# Patient Record
Sex: Female | Born: 1955 | Race: Black or African American | Hispanic: No | Marital: Married | Smoking: Current every day smoker
Health system: Southern US, Community
[De-identification: ages and names within clinical notes are randomized; demographics above are authoritative.]

## PROBLEM LIST (undated history)

## (undated) DIAGNOSIS — I10 Essential (primary) hypertension: Secondary | ICD-10-CM

## (undated) DIAGNOSIS — J45909 Unspecified asthma, uncomplicated: Secondary | ICD-10-CM

## (undated) DIAGNOSIS — M069 Rheumatoid arthritis, unspecified: Secondary | ICD-10-CM

## (undated) DIAGNOSIS — F329 Major depressive disorder, single episode, unspecified: Secondary | ICD-10-CM

## (undated) DIAGNOSIS — E785 Hyperlipidemia, unspecified: Secondary | ICD-10-CM

## (undated) DIAGNOSIS — F32A Depression, unspecified: Secondary | ICD-10-CM

## (undated) HISTORY — DX: Hyperlipidemia, unspecified: E78.5

## (undated) HISTORY — DX: Essential (primary) hypertension: I10

## (undated) HISTORY — DX: Major depressive disorder, single episode, unspecified: F32.9

## (undated) HISTORY — DX: Unspecified asthma, uncomplicated: J45.909

## (undated) HISTORY — DX: Rheumatoid arthritis, unspecified: M06.9

## (undated) HISTORY — PX: MOUTH SURGERY: SHX715

## (undated) HISTORY — DX: Depression, unspecified: F32.A

## (undated) HISTORY — PX: FRACTURE SURGERY: SHX138

## (undated) HISTORY — PX: ABDOMINAL HYSTERECTOMY: SHX81

---

## 2009-10-16 LAB — HM COLONOSCOPY

## 2010-06-08 ENCOUNTER — Encounter: Payer: Self-pay | Admitting: Nurse Practitioner

## 2010-06-08 DIAGNOSIS — E785 Hyperlipidemia, unspecified: Secondary | ICD-10-CM | POA: Insufficient documentation

## 2010-06-08 DIAGNOSIS — I10 Essential (primary) hypertension: Secondary | ICD-10-CM

## 2010-08-27 ENCOUNTER — Encounter: Payer: Self-pay | Admitting: Nurse Practitioner

## 2011-04-02 ENCOUNTER — Ambulatory Visit (INDEPENDENT_AMBULATORY_CARE_PROVIDER_SITE_OTHER): Payer: BC Managed Care – PPO | Admitting: Internal Medicine

## 2011-04-02 ENCOUNTER — Encounter: Payer: Self-pay | Admitting: Internal Medicine

## 2011-04-02 VITALS — BP 130/82 | HR 99 | Temp 98.0°F | Ht 61.0 in | Wt 99.0 lb

## 2011-04-02 DIAGNOSIS — F17200 Nicotine dependence, unspecified, uncomplicated: Secondary | ICD-10-CM

## 2011-04-02 DIAGNOSIS — J439 Emphysema, unspecified: Secondary | ICD-10-CM | POA: Insufficient documentation

## 2011-04-02 DIAGNOSIS — J449 Chronic obstructive pulmonary disease, unspecified: Secondary | ICD-10-CM

## 2011-04-02 MED ORDER — BUDESONIDE-FORMOTEROL FUMARATE 160-4.5 MCG/ACT IN AERO: INHALATION_SPRAY | RESPIRATORY_TRACT | Status: DC

## 2011-04-02 NOTE — Progress Notes (Signed)
  Subjective:    Patient ID: Mallory [name], female    DOB: [birth year], 56 y.o.   MRN: [id number]  HPI  37 yobf smoker with new onset nasal symptoms in 30's mainly in Spring or late Fall when furnaces came with tendency to bronchitis with flares of sinus symptoms  And about 2009 also needed an inhaler but not needing daily x in spring and summer.  04/02/2011 1st pulmonary eval cc 6 months of indolent onset persistent increase sob/ cough/ congestion better with abx and prednisone requiring ER trip to [facility] in Dec 2012 > some better since then  To point where doesn't need albuterol since one week ago. Mucus is white, no limiting sob though not aerobically active. hfa poor (see below).  Sleeping ok without nocturnal  or early am exacerbation  of respiratory  c/o's or need for noct saba. Also denies any obvious fluctuation of symptoms with weather or environmental changes or other aggravating or alleviating factors except as outlined above   Review of Systems  Constitutional: Positive for appetite change and unexpected weight change. Negative for fever.  HENT: Positive for congestion and sneezing. Negative for ear pain, nosebleeds, sore throat, rhinorrhea, trouble swallowing, dental problem, postnasal drip and sinus pressure.   Eyes: Negative for redness and itching.  Respiratory: Positive for cough and shortness of breath. Negative for chest tightness and wheezing.   Cardiovascular: Negative for palpitations and leg swelling.  Gastrointestinal: Negative for nausea and vomiting.  Genitourinary: Negative for dysuria.  Musculoskeletal: Negative for joint swelling.  Skin: Negative for rash.  Neurological: Negative for headaches.  Hematological: Does not bruise/bleed easily.  Psychiatric/Behavioral: Negative for dysphoric mood. The patient is not nervous/anxious.        Objective:   Physical Exam  amb bf minimally cushingnoid  Wt 99 04/02/2011  HEENT: nl dentition, turbinates, and orophanx.  Nl external ear canals without cough reflex   NECK :  without JVD/Nodes/TM/ nl carotid upstrokes bilaterally   LUNGS: no acc muscle use, clear to A and P bilaterally without cough on insp or exp maneuvers   CV:  RRR  no s3 or murmur or increase in P2, no edema   ABD:  soft and nontender with nl excursion in the supine position. No bruits or organomegaly, bowel sounds nl  MS:  warm without deformities, calf tenderness, cyanosis or clubbing  SKIN: warm and dry without lesions    NEURO:  alert, approp, no deficits   CT chest morehead Dec 2012 "nl" per pt report     Assessment & Plan:

## 2011-04-02 NOTE — Assessment & Plan Note (Signed)
I took an extended  opportunity with this patient to outline the consequences of continued cigarette use  in airway disorders based on all the data we have from the multiple national lung health studies (perfomed over decades at millions of dollars in cost)  indicating that smoking cessation, not choice of inhalers or physicians, is the most important aspect of care.   

## 2011-04-02 NOTE — Patient Instructions (Signed)
Symbicort 160 Take 2 puffs first thing in am and then another 2 puffs about 12 hours later.    Work on Musician technique:  relax and gently blow all the way out then take a nice smooth deep breath back in, triggering the inhaler at same time you start breathing in.  Hold for up to 5 seconds if you can and blow out through the nose.  Rinse and gargle with water when done   If your mouth or throat starts to bother you,   I suggest you time the inhaler to your dental care and after using the inhaler(s) brush teeth and tongue with a baking soda containing toothpaste and when you rinse this out, gargle with it first to see if this helps your mouth and throat.    Stopping smoking before it stops you.  Please schedule a follow up visit in 3 months but call sooner if needed

## 2011-04-02 NOTE — Assessment & Plan Note (Signed)
DDX of  difficult airways managment all start with A and  include Adherence, Ace Inhibitors, Acid Reflux, Active Sinus Disease, Alpha 1 Antitripsin deficiency, Anxiety masquerading as Airways dz,  ABPA,  allergy(esp in young), Aspiration (esp in elderly), Adverse effects of DPI,  Active smokers, plus two Bs  = Bronchiectasis and Beta blocker use..and one C= CHF  Active smoking biggest concern, discussed separately  Adherence is always the initial "prime suspect" and is a multilayered concern that requires a "trust but verify" approach in every patient - starting with knowing how to use medications, especially inhalers, correctly, keeping up with refills and understanding the fundamental difference between maintenance and prns vs those medications only taken for a very short course and then stopped and not refilled. The proper method of use, as well as anticipated side effects, of this metered-dose inhaler are discussed and demonstrated to the patient. Improved to 75% with coaching.  Squirt gun to put out a fire analogy reviewed (waiting too long results in use a firehose = prednisone). Ok to use symbicort 2 bid prn in this setting.

## 2011-04-23 ENCOUNTER — Telehealth: Payer: Self-pay | Admitting: *Deleted

## 2011-04-23 NOTE — Telephone Encounter (Signed)
I [id number] to schedule appt in [place] in April. Carron Curie, CMA

## 2011-04-24 NOTE — Telephone Encounter (Signed)
Scheduled appt for pt in April per jennifer nothing further needed.Mallory [name]

## 2011-06-18 ENCOUNTER — Ambulatory Visit: Payer: BC Managed Care – PPO | Admitting: Internal Medicine

## 2011-07-23 ENCOUNTER — Ambulatory Visit (INDEPENDENT_AMBULATORY_CARE_PROVIDER_SITE_OTHER): Payer: BC Managed Care – PPO | Admitting: Internal Medicine

## 2011-07-23 ENCOUNTER — Encounter: Payer: Self-pay | Admitting: Internal Medicine

## 2011-07-23 VITALS — BP 120/82 | HR 97 | Temp 97.7°F | Ht 61.5 in | Wt 100.0 lb

## 2011-07-23 DIAGNOSIS — J449 Chronic obstructive pulmonary disease, unspecified: Secondary | ICD-10-CM

## 2011-07-23 DIAGNOSIS — J4489 Other specified chronic obstructive pulmonary disease: Secondary | ICD-10-CM

## 2011-07-23 DIAGNOSIS — J310 Chronic rhinitis: Secondary | ICD-10-CM

## 2011-07-23 MED ORDER — PREDNISONE (PAK) 10 MG PO TABS: ORAL_TABLET | ORAL | Status: AC

## 2011-07-23 NOTE — Assessment & Plan Note (Signed)
Doing better on symbicort 160 2bid but warned the symbicort does not change the natural hx of the dz  I had an extended discussion with the patient today lasting 15 to 20 minutes of a 25 minute visit on the following issues: I reviewed the Flethcher curve with patient that basically indicates  if you quit smoking when your best day FEV1 is still well preserved (as hers appears to be)  it is highly unlikely you will progress to severe disease and informed the patient there was no medication on the market that has proven to change the curve or the likelihood of progression.  Therefore stopping smoking and maintaining abstinence is the most important aspect of care, not choice of inhalers or for that matter, doctors.    Needs pft's to complete the w/u and more efforts to completely stop smoking in meantime

## 2011-07-23 NOTE — Assessment & Plan Note (Addendum)
Try NS irrigation/ one short course predisone but no abx in absence of symptoms or signs of active infection.   f/u ent prn as does not want to try nasal steroids and most likely this problem is related to smoking also, not allergies

## 2011-07-23 NOTE — Patient Instructions (Addendum)
No change on symbicort.  Prednisone 10 mg take  4 each am x 2 days,   2 each am x 2 days,  1 each am x2days and stop   Saline nasal spray is best bet for your nasal congestion  The key is to stop smoking completely before smoking completely stops you!   Please schedule a follow up visit in 3 months but call sooner if needed with PFT's in [facility]

## 2011-07-23 NOTE — Progress Notes (Signed)
  Subjective:    Patient ID: Mallory [name], female    DOB: 11-Apr-1955    MRN: [id number]  HPI  61 yobf smoker with new onset nasal symptoms in 30's mainly in Spring or late Fall when furnaces came with tendency to bronchitis with flares of sinus symptoms  And about 2009 also needed an inhaler but not needing daily x in spring and summer.  04/02/2011 1st pulmonary eval cc 6 months of indolent onset persistent increase sob/ cough/ congestion better with abx and prednisone requiring ER trip to [facility] in Dec 2012 > some better since then  To point where doesn't need albuterol since one week ago. Mucus is white, no limiting sob though not aerobically active. hfa poor (see below). rec Symbicort 160 Take 2 puffs first thing in am and then another 2 puffs about 12 hours later.  Work on Musician technique  07/23/2011 f/u ov/Mallory [name] still smoking but no prednisone since Dec 2012 cc c/o sinus congestion and tickle in throat but breathing better on symbicort, not really agreeable to nasal spays at this point, has tried in past and "they all burn".  No purulent sputum, no sinus pain or hb symptoms.   Sleeping ok without nocturnal  or early am exacerbation  of respiratory  c/o's or need for noct saba. Also denies any obvious fluctuation of symptoms with weather or environmental changes or other aggravating or alleviating factors except as outlined above.  ROS  At present neg for  any significant sore throat, dysphagia, dental problems,  ear ache,   fever, chills, sweats, unintended wt loss, pleuritic or exertional cp, hemoptysis, palpitations, orthopnea pnd or leg swelling.  Also denies presyncope, palpitations, heartburn, abdominal pain, anorexia, nausea, vomiting, diarrhea  or change in bowel or urinary habits, change in stools or urine, dysuria,hematuria,  rash, arthralgias, visual complaints, headache, numbness weakness or ataxia or problems with walking or coordination. No noted change in  mood/affect or memory.                         Objective:   Physical Exam  amb bf minimally cushingnoid  Wt 99 04/02/2011 > 07/23/2011  100 HEENT: nl dentition, turbinates, and orophanx. Nl external ear canals without cough reflex   NECK :  without JVD/Nodes/TM/ nl carotid upstrokes bilaterally   LUNGS: no acc muscle use, clear to A and P bilaterally without cough on insp or exp maneuvers   CV:  RRR  no s3 or murmur or increase in P2, no edema   ABD:  soft and nontender with nl excursion in the supine position. No bruits or organomegaly, bowel sounds nl  MS:  warm without deformities, calf tenderness, cyanosis or clubbing      CT chest morehead Dec 2012 "nl" per pt report     Assessment & Plan:

## 2011-10-15 ENCOUNTER — Ambulatory Visit: Payer: BC Managed Care – PPO | Admitting: Internal Medicine

## 2012-06-03 ENCOUNTER — Other Ambulatory Visit: Payer: Self-pay | Admitting: Family Medicine

## 2012-06-03 ENCOUNTER — Telehealth: Payer: Self-pay | Admitting: Pharmacist

## 2012-06-03 DIAGNOSIS — G4700 Insomnia, unspecified: Secondary | ICD-10-CM

## 2012-06-03 MED ORDER — DOXEPIN HCL 10 MG PO CAPS: 10.0000 mg | ORAL_CAPSULE | Freq: Every day | ORAL | Status: DC

## 2012-06-03 NOTE — Telephone Encounter (Signed)
Doxepin 10mg  1poqhs rx sent to [facility] to sign off and send to [facility] to replace silenor 3mg  which is not covered by pt's insurance - [place] attempted and denied.

## 2012-06-23 ENCOUNTER — Other Ambulatory Visit: Payer: Self-pay | Admitting: Family Medicine

## 2012-06-25 ENCOUNTER — Other Ambulatory Visit: Payer: Self-pay | Admitting: *Deleted

## 2012-06-25 MED ORDER — MONTELUKAST SODIUM 10 MG PO TABS: 10.0000 mg | ORAL_TABLET | Freq: Every day | ORAL | Status: DC

## 2012-07-07 ENCOUNTER — Telehealth: Payer: Self-pay | Admitting: *Deleted

## 2012-07-07 DIAGNOSIS — G4700 Insomnia, unspecified: Secondary | ICD-10-CM

## 2012-07-07 NOTE — Telephone Encounter (Deleted)
Ins . Co ([employer]) notified that the silenor on Mallory [name] has been denied for a second time.  w

## 2012-07-07 NOTE — Telephone Encounter (Signed)
pateint is on Doxepin which is very similar. DO we need to increase dose?

## 2012-07-07 NOTE — Telephone Encounter (Signed)
[employer] co denied the silenor for the second time, don't know if you want to prescribe something else.

## 2012-07-09 MED ORDER — DOXEPIN HCL 25 MG PO CAPS: 25.0000 mg | ORAL_CAPSULE | Freq: Every day | ORAL | Status: DC

## 2012-07-09 NOTE — Telephone Encounter (Signed)
Rx sent to [facility]

## 2012-07-09 NOTE — Telephone Encounter (Signed)
CALLED INTO PHARMACY

## 2012-07-09 NOTE — Telephone Encounter (Signed)
Yes [facility] [place]

## 2012-07-24 ENCOUNTER — Telehealth: Payer: Self-pay | Admitting: Nurse Practitioner

## 2012-07-24 ENCOUNTER — Encounter: Payer: Self-pay | Admitting: Nurse Practitioner

## 2012-07-24 ENCOUNTER — Ambulatory Visit (INDEPENDENT_AMBULATORY_CARE_PROVIDER_SITE_OTHER): Payer: BC Managed Care – PPO | Admitting: Nurse Practitioner

## 2012-07-24 VITALS — BP 133/86 | HR 81 | Temp 98.6°F | Ht 60.0 in | Wt 103.5 lb

## 2012-07-24 DIAGNOSIS — J322 Chronic ethmoidal sinusitis: Secondary | ICD-10-CM

## 2012-07-24 MED ORDER — AZITHROMYCIN 250 MG PO TABS: ORAL_TABLET | ORAL | Status: DC

## 2012-07-24 MED ORDER — FLUTICASONE PROPIONATE 50 MCG/ACT NA SUSP: 2.0000 | Freq: Every day | NASAL | Status: DC

## 2012-07-24 NOTE — Patient Instructions (Signed)

## 2012-07-24 NOTE — Telephone Encounter (Signed)
appt made for today 

## 2012-07-24 NOTE — Progress Notes (Signed)
  Subjective:    Patient ID: Mallory [name], female    DOB: Sep 02, 1955, 57 y.o.   MRN: [id number]  HPI- Patient in c/o sinus congestion and cough. She has been taking allergy meds which hasn't helped a all. Clear nasal discharge This started about 2 weeks ago. Intermittent fever, decrease in appetite.    Review of Systems  Constitutional: Positive for fever and fatigue.  HENT: Positive for ear pain, congestion, rhinorrhea, sneezing, postnasal drip and sinus pressure.   Respiratory: Positive for cough (nonproductive).   Cardiovascular: Negative.        Objective:   Physical Exam  Constitutional: She is oriented to person, place, and time. She appears well-developed and well-nourished.  HENT:  Right Ear: Hearing, tympanic membrane, external ear and ear canal normal.  Left Ear: Hearing, tympanic membrane, external ear and ear canal normal.  Nose: Mucosal edema and rhinorrhea present.  Mouth/Throat: Mucous membranes are pale. Posterior oropharyngeal erythema: mild.  Cardiovascular: Normal rate, normal heart sounds and intact distal pulses.   Pulmonary/Chest: Effort normal and breath sounds normal.  Course breath sounds  Abdominal: Soft. Bowel sounds are normal.  Neurological: She is alert and oriented to person, place, and time.  Skin: Skin is warm.  Psychiatric: She has a normal mood and affect. Her behavior is normal. Judgment and thought content normal.   BP 133/86  Pulse 81  Temp(Src) 98.6 F (37 C) (Oral)  Ht 5' (1.524 m)  Wt 103 lb 8 oz (46.947 kg)  BMI 20.21 kg/m2        Assessment & Plan:  1. Ethmoid sinusitis *1. Take meds as prescribed 2. Use a cool mist humidifier especially during the winter months and when heat has  been humid. 3. Use saline nose sprays frequently 4. Saline irrigations of the nose can be very helpful if done frequently.  * 4X daily for 1 week*  * Use of a nettie pot can be helpful with this. Follow directions with this* 5. Drink plenty of  fluids 6. Keep thermostat turn down low 7.For any cough or congestion  Use plain Mucinex- regular strength or max strength is fine   * Children- consult with Pharmacist for dosing 8. For fever or aces or pains- take tylenol or ibuprofen appropriate for age and weight.  * for fevers greater than 101 orally you may alternate ibuprofen and tylenol every  3 hours.   - azithromycin (ZITHROMAX Z-PAK) 250 MG tablet; As directed  Dispense: 6 each; Refill: 0 - fluticasone (FLONASE) 50 MCG/ACT nasal spray; Place 2 sprays into the nose daily.  Dispense: 16 g; Refill: 6 Mary-Margaret Daphine Deutscher, FNP

## 2012-08-05 ENCOUNTER — Other Ambulatory Visit: Payer: Self-pay | Admitting: Nurse Practitioner

## 2012-08-21 ENCOUNTER — Other Ambulatory Visit: Payer: Self-pay | Admitting: Nurse Practitioner

## 2012-11-06 ENCOUNTER — Encounter: Payer: Self-pay | Admitting: Physician Assistant

## 2012-11-06 ENCOUNTER — Ambulatory Visit (INDEPENDENT_AMBULATORY_CARE_PROVIDER_SITE_OTHER): Payer: BC Managed Care – PPO

## 2012-11-06 ENCOUNTER — Ambulatory Visit (INDEPENDENT_AMBULATORY_CARE_PROVIDER_SITE_OTHER): Payer: BC Managed Care – PPO | Admitting: Physician Assistant

## 2012-11-06 VITALS — BP 121/78 | HR 102 | Temp 98.3°F | Ht 60.0 in | Wt 103.0 lb

## 2012-11-06 DIAGNOSIS — M25521 Pain in right elbow: Secondary | ICD-10-CM

## 2012-11-06 DIAGNOSIS — M25529 Pain in unspecified elbow: Secondary | ICD-10-CM

## 2012-11-06 DIAGNOSIS — M6580 Other synovitis and tenosynovitis, unspecified site: Secondary | ICD-10-CM

## 2012-11-06 DIAGNOSIS — M7710 Lateral epicondylitis, unspecified elbow: Secondary | ICD-10-CM

## 2012-11-06 DIAGNOSIS — M7711 Lateral epicondylitis, right elbow: Secondary | ICD-10-CM

## 2012-11-06 DIAGNOSIS — M778 Other enthesopathies, not elsewhere classified: Secondary | ICD-10-CM

## 2012-11-06 MED ORDER — DICLOFENAC POTASSIUM 50 MG PO TABS: 50.0000 mg | ORAL_TABLET | Freq: Three times a day (TID) | ORAL | Status: DC

## 2012-11-06 NOTE — Patient Instructions (Signed)
Wear elbow brace for 4-6 wks. Take medication as prescribed. Refrain from using hand/arm in any movement that aggravates and causes pain until pain free. If you begin having palpitations, stop taking medication immediately. If symptoms persist or worsen, f/u in office for referral for MRI.

## 2012-11-25 NOTE — Progress Notes (Signed)
  Subjective:    Patient ID: Mallory [name], female    DOB: 1956-03-13, 57 y.o.   MRN: [id number]  HPI 57 y/o female presents for intermittent R elbow pain and weakness x 3 wks. Pain is worse with picking up items and partially relieved with rest. She has tried OTC ibuprofen 400mg  occasionally with only minimal relief. No known injury.     Review of Systems  Constitutional: Negative.   HENT: Negative.   Eyes: Negative.   Endocrine: Negative.   Musculoskeletal: Positive for myalgias and arthralgias. Negative for back pain, joint swelling and gait problem.  Skin: Negative for color change, pallor, rash and wound.       Objective:   Physical Exam  Constitutional: She is oriented to person, place, and time. She appears well-developed and well-nourished. No distress.  Musculoskeletal: She exhibits no edema and no tenderness.  Negative for DeQuirvan's Positive resisted pronation and supination suggesting epicondylitis.   Neurological: She is alert and oriented to person, place, and time.  Skin: She is not diaphoretic.          Assessment & Plan:  Epicondylitis: Xrays were negative for fracture so I feel like this is more muscular and tendon related. I have instructed patient to wear an elbow brace during the day, off at night for several weeks until symptoms resolve. I prescribed Diclofenac PO for symptomatic relief. Advised patient to avoid lifting or straining. If s/s do have not resolved or been significantly relieved following treatment plan, I suggested she follow up with an orthopedist for further assessment.

## 2012-12-02 ENCOUNTER — Other Ambulatory Visit: Payer: Self-pay | Admitting: Family Medicine

## 2013-01-07 ENCOUNTER — Telehealth: Payer: Self-pay | Admitting: General Practice

## 2013-01-07 NOTE — Telephone Encounter (Signed)
Needs to be seen, especially with cough that has lasted 3 weeks.

## 2013-01-12 NOTE — Telephone Encounter (Signed)
Pt called and aware will need to be seen- she was offered appt for pm clinic due to work schedule and she denied it and states she will call back if she decides to come.

## 2013-01-13 ENCOUNTER — Encounter: Payer: Self-pay | Admitting: Family Medicine

## 2013-01-13 ENCOUNTER — Ambulatory Visit (INDEPENDENT_AMBULATORY_CARE_PROVIDER_SITE_OTHER): Payer: BC Managed Care – PPO | Admitting: Family Medicine

## 2013-01-13 VITALS — BP 128/83 | HR 83 | Temp 98.7°F | Ht 61.0 in | Wt 100.0 lb

## 2013-01-13 DIAGNOSIS — J209 Acute bronchitis, unspecified: Secondary | ICD-10-CM

## 2013-01-13 LAB — POCT CBC
Granulocyte percent: 43.9 %G (ref 37–80)
HCT, POC: 44.4 % (ref 37.7–47.9)
Hemoglobin: 14.6 g/dL (ref 12.2–16.2)
Lymph, poc: 4 — AB (ref 0.6–3.4)
MCH, POC: 30.6 pg (ref 27–31.2)
MCHC: 32.9 g/dL (ref 31.8–35.4)
MCV: 93 fL (ref 80–97)
MPV: 7.5 fL (ref 0–99.8)
POC Granulocyte: 3.2 (ref 2–6.9)
POC LYMPH PERCENT: 55 %L — AB (ref 10–50)
Platelet Count, POC: 299 10*3/uL (ref 142–424)
RBC: 4.8 M/uL (ref 4.04–5.48)
RDW, POC: 13.7 %
WBC: 7.3 10*3/uL (ref 4.6–10.2)

## 2013-01-13 MED ORDER — METHYLPREDNISOLONE (PAK) 4 MG PO TABS: ORAL_TABLET | ORAL | Status: DC

## 2013-01-13 MED ORDER — AMOXICILLIN 875 MG PO TABS: 875.0000 mg | ORAL_TABLET | Freq: Two times a day (BID) | ORAL | Status: DC

## 2013-01-13 MED ORDER — BENZONATATE 200 MG PO CAPS: 200.0000 mg | ORAL_CAPSULE | Freq: Two times a day (BID) | ORAL | Status: DC | PRN

## 2013-01-13 NOTE — Patient Instructions (Signed)

## 2013-01-13 NOTE — Progress Notes (Signed)
  Subjective:    Patient ID: Mallory [name], female    DOB: 05-08-1955, 57 y.o.   MRN: [id number]  HPI This 57 y.o. female presents for evaluation of c/o cough and congestion. She is due for CPE labs and CPE.  She states she had a BMD and she  Was rx'd some medicine she couldn't tolerate and this was DC'd. .   Review of Systems C/o cough No chest pain, SOB, HA, dizziness, vision change, N/V, diarrhea, constipation, dysuria, urinary urgency or frequency, myalgias, arthralgias or rash.      Objective:   Physical Exam Vital signs noted  Well developed well nourished female.  HEENT - Head atraumatic Normocephalic                Eyes - PERRLA, Conjuctiva - clear Sclera- Clear EOMI                Ears - EAC's Wnl TM's Wnl Gross Hearing WNL                Nose - Nares patent                 Throat - oropharanx wnl Respiratory - Lungs CTA bilateral Cardiac - RRR S1 and S2 without murmur GI - Abdomen soft Nontender and bowel sounds active x 4 Extremities - No edema. Neuro - Grossly intact.       Assessment & Plan:  Acute bronchitis - Plan: methylPREDNIsolone (MEDROL DOSPACK) 4 MG tablet, amoxicillin (AMOXIL) 875 MG tablet, benzonatate (TESSALON) 200 MG capsule, POCT CBC, CMP14+EGFR, Lipid panel, Vit D  25 hydroxy (rtn osteoporosis monitoring), Thyroid Panel With TSH

## 2013-01-14 LAB — LIPID PANEL
Chol/HDL Ratio: 2.4 ratio units (ref 0.0–4.4)
Cholesterol, Total: 245 mg/dL — ABNORMAL HIGH (ref 100–199)
HDL: 101 mg/dL (ref >39)
LDL Calculated: 114 mg/dL — ABNORMAL HIGH (ref 0–99)
Triglycerides: 150 mg/dL — ABNORMAL HIGH (ref 0–149)
VLDL Cholesterol Cal: 30 mg/dL (ref 5–40)

## 2013-01-14 LAB — CMP14+EGFR
ALT: 15 IU/L (ref 0–32)
AST: 19 IU/L (ref 0–40)
Albumin/Globulin Ratio: 2 (ref 1.1–2.5)
Albumin: 5 g/dL (ref 3.5–5.5)
Alkaline Phosphatase: 84 IU/L (ref 39–117)
BUN/Creatinine Ratio: 19 (ref 9–23)
BUN: 13 mg/dL (ref 6–24)
CO2: 26 mmol/L (ref 18–29)
Calcium: 10.4 mg/dL — ABNORMAL HIGH (ref 8.7–10.2)
Chloride: 98 mmol/L (ref 97–108)
Creatinine, Ser: 0.69 mg/dL (ref 0.57–1.00)
GFR calc Af Amer: 113 mL/min/{1.73_m2} (ref >59)
GFR calc non Af Amer: 98 mL/min/{1.73_m2} (ref >59)
Globulin, Total: 2.5 g/dL (ref 1.5–4.5)
Glucose: 86 mg/dL (ref 65–99)
Potassium: 4.7 mmol/L (ref 3.5–5.2)
Sodium: 141 mmol/L (ref 134–144)
Total Bilirubin: 0.8 mg/dL (ref 0.0–1.2)
Total Protein: 7.5 g/dL (ref 6.0–8.5)

## 2013-01-14 LAB — THYROID PANEL WITH TSH
Free Thyroxine Index: 3 (ref 1.2–4.9)
T3 Uptake Ratio: 30 % (ref 24–39)
T4, Total: 10.1 ug/dL (ref 4.5–12.0)
TSH: 2.51 u[IU]/mL (ref 0.450–4.500)

## 2013-01-14 LAB — VITAMIN D 25 HYDROXY (VIT D DEFICIENCY, FRACTURES): Vit D, 25-Hydroxy: 29.5 ng/mL — ABNORMAL LOW (ref 30.0–100.0)

## 2013-01-21 ENCOUNTER — Telehealth: Payer: Self-pay | Admitting: Family Medicine

## 2013-01-21 ENCOUNTER — Encounter: Payer: Self-pay | Admitting: *Deleted

## 2013-01-21 NOTE — Telephone Encounter (Signed)
Spoke with family member and is aware copy of her labs have been mailed and requested that pt call if any questions

## 2013-01-21 NOTE — Progress Notes (Signed)
Quick Note:  Copy of labs sent to patient ______ 

## 2013-01-22 ENCOUNTER — Telehealth: Payer: Self-pay | Admitting: *Deleted

## 2013-01-22 NOTE — Telephone Encounter (Signed)
Pt notified of lab results Verbalizes understanding 

## 2013-01-26 ENCOUNTER — Other Ambulatory Visit: Payer: Self-pay | Admitting: Family Medicine

## 2013-01-29 ENCOUNTER — Other Ambulatory Visit: Payer: BC Managed Care – PPO | Admitting: General Practice

## 2013-02-13 ENCOUNTER — Other Ambulatory Visit: Payer: Self-pay | Admitting: Nurse Practitioner

## 2013-05-18 ENCOUNTER — Telehealth: Payer: Self-pay | Admitting: Family Medicine

## 2013-05-18 ENCOUNTER — Encounter: Payer: Self-pay | Admitting: Family Medicine

## 2013-05-18 ENCOUNTER — Ambulatory Visit (INDEPENDENT_AMBULATORY_CARE_PROVIDER_SITE_OTHER): Payer: PRIVATE HEALTH INSURANCE

## 2013-05-18 ENCOUNTER — Ambulatory Visit (INDEPENDENT_AMBULATORY_CARE_PROVIDER_SITE_OTHER): Payer: PRIVATE HEALTH INSURANCE | Admitting: Family Medicine

## 2013-05-18 VITALS — BP 140/89 | HR 87 | Temp 99.2°F | Ht 61.0 in | Wt 101.0 lb

## 2013-05-18 DIAGNOSIS — R6889 Other general symptoms and signs: Secondary | ICD-10-CM

## 2013-05-18 DIAGNOSIS — J209 Acute bronchitis, unspecified: Secondary | ICD-10-CM

## 2013-05-18 DIAGNOSIS — R5381 Other malaise: Secondary | ICD-10-CM

## 2013-05-18 DIAGNOSIS — R5383 Other fatigue: Secondary | ICD-10-CM

## 2013-05-18 DIAGNOSIS — J329 Chronic sinusitis, unspecified: Secondary | ICD-10-CM

## 2013-05-18 DIAGNOSIS — Z720 Tobacco use: Secondary | ICD-10-CM

## 2013-05-18 DIAGNOSIS — F17200 Nicotine dependence, unspecified, uncomplicated: Secondary | ICD-10-CM

## 2013-05-18 LAB — POCT CBC
Granulocyte percent: 51.6 %G (ref 37–80)
HEMATOCRIT: 46.4 % (ref 37.7–47.9)
Hemoglobin: 15.4 g/dL (ref 12.2–16.2)
Lymph, poc: 3.7 — AB (ref 0.6–3.4)
MCH, POC: 31.5 pg — AB (ref 27–31.2)
MCHC: 33.1 g/dL (ref 31.8–35.4)
MCV: 95 fL (ref 80–97)
MPV: 7.4 fL (ref 0–99.8)
PLATELET COUNT, POC: 309 10*3/uL (ref 142–424)
POC Granulocyte: 4.4 (ref 2–6.9)
POC LYMPH PERCENT: 43.5 %L (ref 10–50)
RBC: 4.9 M/uL (ref 4.04–5.48)
RDW, POC: 12.8 %
WBC: 8.5 10*3/uL (ref 4.6–10.2)

## 2013-05-18 LAB — POCT INFLUENZA A/B
Influenza A, POC: NEGATIVE
Influenza B, POC: NEGATIVE

## 2013-05-18 MED ORDER — METHYLPREDNISOLONE ACETATE 80 MG/ML IJ SUSP
60.0000 mg | Freq: Once | INTRAMUSCULAR | Status: AC
  Administered 2013-05-18: 60 mg via INTRAMUSCULAR

## 2013-05-18 MED ORDER — AZITHROMYCIN 250 MG PO TABS: ORAL_TABLET | ORAL | Status: DC

## 2013-05-18 MED ORDER — PREDNISONE 10 MG PO TABS: ORAL_TABLET | ORAL | Status: DC

## 2013-05-18 NOTE — Telephone Encounter (Signed)
Appt given for today per patients request 

## 2013-05-18 NOTE — Progress Notes (Signed)
Subjective:    Patient ID: Mallory [name], female    DOB: 10/04/1955, 58 y.o.   MRN: [id number]  HPI Patient here today for cough, congestion and sinus pressure x 2 weeks. The patient started out with a sore throat. She is a smoker.      Patient Active Problem List   Diagnosis Date Noted  . Chronic rhinitis 07/23/2011  . COPD (chronic obstructive pulmonary disease) 04/02/2011  . Smoker 04/02/2011  . Hypertension   . Hyperlipidemia    Outpatient Encounter Prescriptions as of 05/18/2013  Medication Sig  . albuterol (PROAIR HFA) 108 (90 BASE) MCG/ACT inhaler Inhale 2 puffs into the lungs as needed.    Marland Kitchen BENICAR 20 MG tablet TAKE 1 TABLET EVERY DAY  . fluticasone (FLONASE) 50 MCG/ACT nasal spray Place 2 sprays into the nose daily.  . SYMBICORT 160-4.5 MCG/ACT inhaler TAKE TWO PUFFS FIRST THING IN THE MORNING AND THEN ANOTHER TWO PUFFS 12 HOURS LATER  . montelukast (SINGULAIR) 10 MG tablet TAKE 1 TABLET (10 MG TOTAL) BY MOUTH AT BEDTIME.  . simvastatin (ZOCOR) 10 MG tablet TAKE 1 TABLET AT BEDTIME  . [DISCONTINUED] amoxicillin (AMOXIL) 875 MG tablet Take 1 tablet (875 mg total) by mouth 2 (two) times daily.  . [DISCONTINUED] benzonatate (TESSALON) 200 MG capsule Take 1 capsule (200 mg total) by mouth 2 (two) times daily as needed for cough.  . [DISCONTINUED] methylPREDNIsolone (MEDROL DOSPACK) 4 MG tablet follow package directions    Review of Systems  Constitutional: Positive for fever.  HENT: Positive for congestion, postnasal drip and sinus pressure.   Eyes: Negative.   Respiratory: Positive for cough.   Cardiovascular: Negative.   Gastrointestinal: Positive for nausea.  Endocrine: Negative.   Genitourinary: Negative.   Musculoskeletal: Negative.   Skin: Negative.   Allergic/Immunologic: Negative.   Neurological: Positive for dizziness and headaches.  Hematological: Negative.   Psychiatric/Behavioral: Negative.        Objective:   Physical Exam  Nursing note and  vitals reviewed. Constitutional: She is oriented to person, place, and time. She appears well-developed and well-nourished. No distress.  HENT:  Head: Normocephalic and atraumatic.  Right Ear: External ear normal.  Left Ear: External ear normal.  Mouth/Throat: No oropharyngeal exudate.  Nasal pallor, slightly red throat and posteriorly, frontal and ethmoid sinus tenderness  Eyes: Conjunctivae and EOM are normal. Pupils are equal, round, and reactive to light. Right eye exhibits no discharge. Left eye exhibits no discharge. No scleral icterus.  Neck: Normal range of motion. Neck supple. No thyromegaly present.  Cardiovascular: Normal rate, regular rhythm, normal heart sounds and intact distal pulses.  Exam reveals no gallop and no friction rub.   No murmur heard. Pulmonary/Chest: Effort normal. No respiratory distress. She has wheezes. She has no rales. She exhibits no tenderness.  Tight cough and 8 wheezes bilaterally  Abdominal: Bowel sounds are normal. She exhibits no mass.  Musculoskeletal: Normal range of motion.  Lymphadenopathy:    She has cervical adenopathy (small bilateral anterior cervical adenopathy).  Neurological: She is alert and oriented to person, place, and time.  Skin: Skin is warm and dry. No rash noted.  Psychiatric: She has a normal mood and affect. Her behavior is normal. Judgment and thought content normal.   BP 140/89  Pulse 87  Temp(Src) 99.2 F (37.3 C) (Oral)  Ht 5\' 1"  (1.549 m)  Wt 101 lb (45.813 kg)  BMI 19.09 kg/m2  [facility] reading (PRIMARY) by  Dr. Brunilda Payor x-ray --no active disease  Results for orders placed in visit on 05/18/13  POCT INFLUENZA A/B      Result Value Ref Range   Influenza A, POC Negative     Influenza B, POC Negative    POCT CBC      Result Value Ref Range   WBC 8.5  4.6 - 10.2 K/uL   Lymph, poc 3.7 (*) 0.6 - 3.4   POC LYMPH PERCENT 43.5  10 - 50 %L   POC Granulocyte 4.4  2 - 6.9   Granulocyte  percent 51.6  37 - 80 %G   RBC 4.9  4.04 - 5.48 M/uL   Hemoglobin 15.4  12.2 - 16.2 g/dL   HCT, POC 46.4  37.7 - 47.9 %   MCV 95.0  80 - 97 fL   MCH, POC 31.5 (*) 27 - 31.2 pg   MCHC 33.1  31.8 - 35.4 g/dL   RDW, POC 12.8     Platelet Count, POC 309.0  142 - 424 K/uL   MPV 7.4  0 - 99.8 fL         Assessment & Plan:   1. Flu-like symptoms - POCT Influenza A/B - POCT CBC - DG Chest 2 View; Future  2. Acute bronchitis - predniSONE (DELTASONE) 10 MG tablet; 1 tablet 4 times a day for 2 days,  1 tablet 3 times a day for 2 days,  1 tablet 2 times a day for 2 days, 1 tablet daily for 2 days  Dispense: 20 tablet; Refill: 0 - azithromycin (ZITHROMAX) 250 MG tablet; 2 pills the first day then one daily until completed  Dispense: 6 tablet; Refill: 0 - methylPREDNISolone acetate (DEPO-MEDROL) injection 60 mg; Inject 0.75 mLs (60 mg total) into the muscle once.  3. Rhinosinusitis - azithromycin (ZITHROMAX) 250 MG tablet; 2 pills the first day then one daily until completed  Dispense: 6 tablet; Refill: 0  4. Malaise  5. Nicotine abuse Patient Instructions  Drink plenty of fluids Take Mucinex maximum strength, blue and white in color, 1 twice daily with a large glass of water. This medication is over-the-counter. Take medication and antibiotic as directed Take Tylenol and Advil as needed for aches pains and fever Continue to use your Symbicort, Singulair, and nose spray regularly Try to stop smoking    Arrie Senate MD

## 2013-05-18 NOTE — Patient Instructions (Addendum)
Drink plenty of fluids Take Mucinex maximum strength, blue and white in color, 1 twice daily with a large glass of water. This medication is over-the-counter. Take medication and antibiotic as directed Take Tylenol and Advil as needed for aches pains and fever Continue to use your Symbicort, Singulair, and nose spray regularly Try to stop smoking

## 2013-05-27 ENCOUNTER — Encounter: Payer: Self-pay | Admitting: Family Medicine

## 2013-05-27 ENCOUNTER — Ambulatory Visit (INDEPENDENT_AMBULATORY_CARE_PROVIDER_SITE_OTHER): Payer: PRIVATE HEALTH INSURANCE | Admitting: Family Medicine

## 2013-05-27 VITALS — BP 193/129 | HR 102 | Temp 99.2°F | Ht 61.0 in | Wt 104.0 lb

## 2013-05-27 DIAGNOSIS — F17200 Nicotine dependence, unspecified, uncomplicated: Secondary | ICD-10-CM

## 2013-05-27 DIAGNOSIS — R05 Cough: Secondary | ICD-10-CM

## 2013-05-27 DIAGNOSIS — J329 Chronic sinusitis, unspecified: Secondary | ICD-10-CM

## 2013-05-27 DIAGNOSIS — R059 Cough, unspecified: Secondary | ICD-10-CM

## 2013-05-27 DIAGNOSIS — I10 Essential (primary) hypertension: Secondary | ICD-10-CM

## 2013-05-27 DIAGNOSIS — J310 Chronic rhinitis: Secondary | ICD-10-CM

## 2013-05-27 LAB — POCT CBC
GRANULOCYTE PERCENT: 73.8 % (ref 37–80)
HCT, POC: 43.9 % (ref 37.7–47.9)
Hemoglobin: 14.1 g/dL (ref 12.2–16.2)
Lymph, poc: 2.4 (ref 0.6–3.4)
MCH: 30.4 pg (ref 27–31.2)
MCHC: 32.1 g/dL (ref 31.8–35.4)
MCV: 94.7 fL (ref 80–97)
MPV: 7.3 fL (ref 0–99.8)
PLATELET COUNT, POC: 334 10*3/uL (ref 142–424)
POC Granulocyte: 7.7 — AB (ref 2–6.9)
POC LYMPH PERCENT: 23.1 %L (ref 10–50)
RBC: 4.6 M/uL (ref 4.04–5.48)
RDW, POC: 13.7 %
WBC: 10.5 10*3/uL — AB (ref 4.6–10.2)

## 2013-05-27 MED ORDER — AMOXICILLIN-POT CLAVULANATE 875-125 MG PO TABS: 1.0000 | ORAL_TABLET | Freq: Two times a day (BID) | ORAL | Status: DC

## 2013-05-27 NOTE — Patient Instructions (Signed)
Take antibiotic as directed Take additional blood pressure medication as directed

## 2013-05-27 NOTE — Progress Notes (Signed)
Subjective:    Patient ID: Mallory [name], female    DOB: Nov 29, 1955, 58 y.o.   MRN: [id number]  HPI Patient here today for 10 day recheck of bronchitis, cough, congestion. Patient today still feels bad and presents today with elevated blood pressure and low grade fever.      Patient Active Problem List   Diagnosis Date Noted  . Chronic rhinitis 07/23/2011  . COPD (chronic obstructive pulmonary disease) 04/02/2011  . Smoker 04/02/2011  . Hypertension   . Hyperlipidemia    Outpatient Encounter Prescriptions as of 05/27/2013  Medication Sig  . albuterol (PROAIR HFA) 108 (90 BASE) MCG/ACT inhaler Inhale 2 puffs into the lungs as needed.    Marland Kitchen BENICAR 20 MG tablet TAKE 1 TABLET EVERY DAY  . fluticasone (FLONASE) 50 MCG/ACT nasal spray Place 2 sprays into the nose daily.  . montelukast (SINGULAIR) 10 MG tablet TAKE 1 TABLET (10 MG TOTAL) BY MOUTH AT BEDTIME.  . simvastatin (ZOCOR) 10 MG tablet TAKE 1 TABLET AT BEDTIME  . SYMBICORT 160-4.5 MCG/ACT inhaler TAKE TWO PUFFS FIRST THING IN THE MORNING AND THEN ANOTHER TWO PUFFS 12 HOURS LATER  . [DISCONTINUED] azithromycin (ZITHROMAX) 250 MG tablet 2 pills the first day then one daily until completed  . [DISCONTINUED] predniSONE (DELTASONE) 10 MG tablet 1 tablet 4 times a day for 2 days,  1 tablet 3 times a day for 2 days,  1 tablet 2 times a day for 2 days, 1 tablet daily for 2 days    Review of Systems  Constitutional: Positive for fever.  HENT: Positive for congestion and sinus pressure. Negative for ear pain.   Eyes: Negative.   Respiratory: Positive for cough and wheezing (not as bad as it was).   Cardiovascular: Negative.   Gastrointestinal: Negative.  Negative for nausea, vomiting and diarrhea.  Endocrine: Negative.   Genitourinary: Negative.   Musculoskeletal: Positive for neck pain (lymph nodes?).  Skin: Negative.   Allergic/Immunologic: Negative.   Neurological: Positive for headaches.  Hematological: Negative.     Psychiatric/Behavioral: Negative.        Objective:   Physical Exam  Nursing note and vitals reviewed. Constitutional: She is oriented to person, place, and time. She appears well-developed and well-nourished. No distress.  HENT:  Head: Normocephalic and atraumatic.  Right Ear: External ear normal.  Left Ear: External ear normal.  Mouth/Throat: Oropharynx is clear and moist. No oropharyngeal exudate.  Patient has ethmoid and bilateral maxillary sinus tenderness, there is also nasal congestion  Eyes: Conjunctivae and EOM are normal. Pupils are equal, round, and reactive to light. Right eye exhibits no discharge. Left eye exhibits no discharge. No scleral icterus.  Neck: Normal range of motion. Neck supple. No thyromegaly present.  There are small anterior cervical nodes bilaterally  Cardiovascular: Normal rate and regular rhythm.   No murmur heard. Pulmonary/Chest: Effort normal. No respiratory distress. She has wheezes. She has no rales. She exhibits no tenderness.  Sparse expiratory wheezes with no rales  Abdominal: Soft. Bowel sounds are normal. She exhibits no mass. There is no tenderness. There is no rebound and no guarding.  Musculoskeletal: Normal range of motion. She exhibits no edema.  Lymphadenopathy:    She has no cervical adenopathy.  Neurological: She is alert and oriented to person, place, and time.  Skin: Skin is warm and dry.  Psychiatric: She has a normal mood and affect. Her behavior is normal. Judgment and thought content normal.   BP 193/129  Pulse 102  Temp(Src) 99.2 F (37.3 C) (Oral)  Ht _0  (1.549 m)  Wt 104 lb (47.174 kg)  BMI 19.66 kg/m2  Repeat blood pressure in supine position--180/96  Results for orders placed in visit on 05/27/13  POCT CBC      Result Value Ref Range   WBC 10.5 (*) 4.6 - 10.2 K/uL   Lymph, poc 2.4  0.6 - 3.4   POC LYMPH PERCENT 23.1  10 - 50 %L   POC Granulocyte 7.7 (*) 2 - 6.9   Granulocyte percent 73.8  37 - 80 %G    RBC 4.6  4.04 - 5.48 M/uL   Hemoglobin 14.1  12.2 - 16.2 g/dL   HCT, POC 43.9  37.7 - 47.9 %   MCV 94.7  80 - 97 fL   MCH, POC 30.4  27 - 31.2 pg   MCHC 32.1  31.8 - 35.4 g/dL   RDW, POC 13.7     Platelet Count, POC 334.0  142 - 424 K/uL   MPV 7.3  0 - 99.8 fL       Assessment & Plan:  1. Cough - POCT CBC - BMP8+EGFR - CT Maxillofacial WO CM; Future  2. Sinusitis, chronic - CT Maxillofacial WO CM; Future - amoxicillin-clavulanate (AUGMENTIN) 875-125 MG per tablet; Take 1 tablet by mouth 2 (two) times daily.  Dispense: 20 tablet; Refill: 0  3. Hypertension -Change Benicar 20  to Benicar 40/12.5  4. Smoker -Stop smoking  5. Rhinosinusitis - amoxicillin-clavulanate (AUGMENTIN) 875-125 MG per tablet; Take 1 tablet by mouth 2 (two) times daily.  Dispense: 20 tablet; Refill: 0  Patient Instructions  Take antibiotic as directed Take additional blood pressure medication as directed   Arrie Senate MD

## 2013-05-28 ENCOUNTER — Ambulatory Visit (HOSPITAL_BASED_OUTPATIENT_CLINIC_OR_DEPARTMENT_OTHER)
Admission: RE | Admit: 2013-05-28 | Discharge: 2013-05-28 | Disposition: A | Discharge status: Home / Self Care | Payer: 59 | Source: Ambulatory Visit | Attending: Family Medicine | Admitting: Family Medicine

## 2013-05-28 DIAGNOSIS — I6529 Occlusion and stenosis of unspecified carotid artery: Secondary | ICD-10-CM | POA: Insufficient documentation

## 2013-05-28 DIAGNOSIS — R221 Localized swelling, mass and lump, neck: Secondary | ICD-10-CM

## 2013-05-28 DIAGNOSIS — R05 Cough: Secondary | ICD-10-CM

## 2013-05-28 DIAGNOSIS — R059 Cough, unspecified: Secondary | ICD-10-CM

## 2013-05-28 DIAGNOSIS — R220 Localized swelling, mass and lump, head: Secondary | ICD-10-CM | POA: Insufficient documentation

## 2013-05-28 DIAGNOSIS — J329 Chronic sinusitis, unspecified: Secondary | ICD-10-CM

## 2013-05-28 DIAGNOSIS — J3489 Other specified disorders of nose and nasal sinuses: Secondary | ICD-10-CM | POA: Insufficient documentation

## 2013-05-28 DIAGNOSIS — R51 Headache: Secondary | ICD-10-CM | POA: Insufficient documentation

## 2013-05-28 LAB — BMP8+EGFR
BUN/Creatinine Ratio: 23 (ref 9–23)
BUN: 16 mg/dL (ref 6–24)
CALCIUM: 10.4 mg/dL — AB (ref 8.7–10.2)
CO2: 24 mmol/L (ref 18–29)
CREATININE: 0.69 mg/dL (ref 0.57–1.00)
Chloride: 99 mmol/L (ref 97–108)
GFR calc Af Amer: 113 mL/min/{1.73_m2} (ref >59)
GFR, EST NON AFRICAN AMERICAN: 98 mL/min/{1.73_m2} (ref >59)
GLUCOSE: 114 mg/dL — AB (ref 65–99)
Potassium: 4.8 mmol/L (ref 3.5–5.2)
SODIUM: 141 mmol/L (ref 134–144)

## 2013-05-31 ENCOUNTER — Ambulatory Visit (INDEPENDENT_AMBULATORY_CARE_PROVIDER_SITE_OTHER): Payer: PRIVATE HEALTH INSURANCE | Admitting: Family Medicine

## 2013-05-31 ENCOUNTER — Ambulatory Visit (INDEPENDENT_AMBULATORY_CARE_PROVIDER_SITE_OTHER): Payer: PRIVATE HEALTH INSURANCE

## 2013-05-31 ENCOUNTER — Encounter: Payer: Self-pay | Admitting: Family Medicine

## 2013-05-31 VITALS — BP 116/81 | HR 113 | Temp 98.1°F | Wt 98.8 lb

## 2013-05-31 DIAGNOSIS — R0602 Shortness of breath: Secondary | ICD-10-CM

## 2013-05-31 DIAGNOSIS — J329 Chronic sinusitis, unspecified: Secondary | ICD-10-CM

## 2013-05-31 DIAGNOSIS — R059 Cough, unspecified: Secondary | ICD-10-CM

## 2013-05-31 DIAGNOSIS — B370 Candidal stomatitis: Secondary | ICD-10-CM

## 2013-05-31 DIAGNOSIS — R05 Cough: Secondary | ICD-10-CM

## 2013-05-31 DIAGNOSIS — R079 Chest pain, unspecified: Secondary | ICD-10-CM

## 2013-05-31 DIAGNOSIS — E860 Dehydration: Secondary | ICD-10-CM

## 2013-05-31 LAB — POCT CBC
Granulocyte percent: 59.5 %G (ref 37–80)
HCT, POC: 49.6 % — AB (ref 37.7–47.9)
Hemoglobin: 16.2 g/dL (ref 12.2–16.2)
Lymph, poc: 4.2 — AB (ref 0.6–3.4)
MCH, POC: 30.9 pg (ref 27–31.2)
MCHC: 32.7 g/dL (ref 31.8–35.4)
MCV: 94.7 fL (ref 80–97)
MPV: 6.8 fL (ref 0–99.8)
POC Granulocyte: 7 — AB (ref 2–6.9)
POC LYMPH PERCENT: 35.5 %L (ref 10–50)
Platelet Count, POC: 389 10*3/uL (ref 142–424)
RBC: 5.2 M/uL (ref 4.04–5.48)
RDW, POC: 14 %
WBC: 11.8 10*3/uL — AB (ref 4.6–10.2)

## 2013-05-31 MED ORDER — ONDANSETRON 8 MG PO TBDP: 8.0000 mg | ORAL_TABLET | Freq: Three times a day (TID) | ORAL | Status: DC | PRN

## 2013-05-31 MED ORDER — LEVOFLOXACIN 500 MG PO TABS: 500.0000 mg | ORAL_TABLET | Freq: Every day | ORAL | Status: DC

## 2013-05-31 MED ORDER — NYSTATIN 100000 UNIT/ML MT SUSP: 5.0000 mL | Freq: Four times a day (QID) | OROMUCOSAL | Status: DC

## 2013-05-31 MED ORDER — HYDROCODONE-ACETAMINOPHEN 5-325 MG PO TABS: 1.0000 | ORAL_TABLET | Freq: Four times a day (QID) | ORAL | Status: DC | PRN

## 2013-05-31 MED ORDER — BENZONATATE 100 MG PO CAPS: 100.0000 mg | ORAL_CAPSULE | Freq: Three times a day (TID) | ORAL | Status: DC | PRN

## 2013-05-31 NOTE — Progress Notes (Signed)
   Subjective:    Patient ID: Mallory [name], female    DOB: Oct 29, 1955, 58 y.o.   MRN: [id number]  HPI This 58 y.o. female presents for evaluation of left chest pain due coughing.  She has recently been tx For sinus infection and had a CT which showed chronic sinusitis.  She has been nauseated And she has not been able to take po fluids well.  She has been coughing a lot and she has been Hurting in her left anterior and lateral chest wall .   Review of Systems C/o chest pain, nausea, URI sx's No  SOB, HA, dizziness, vision change, N/V, diarrhea, constipation, dysuria, urinary urgency or frequency, myalgias, arthralgias or rash.     Objective:   Physical Exam  Vital signs noted  Well developed well nourished female.  HEENT - Head atraumatic Normocephalic                Eyes - PERRLA, Conjuctiva - clear Sclera- Clear EOMI                Ears - EAC's Wnl TM's Wnl Gross Hearing WNL                Throat - oropharanx wnl Respiratory - Lungs CTA bilateral Cardiac - RRR S1 and S2 without murmur.  B/P 80 palp standing and pulse 120 standing GI - Abdomen soft Nontender and bowel sounds active x 4 Extremities - No edema. Neuro - Grossly intact.     CXR - Copd, nad Left rib series - No fx Prelimnary reading by Gwyndolyn Saxon [place],FNP EKG - NSR without acute st-t changes Assessment & Plan:  SOB (shortness of breath) - Plan: DG Ribs Unilateral W/Chest Left, POCT CBC, POCT CBC  Chest pain - Plan: EKG 12-Lead, HYDROcodone-acetaminophen (NORCO) 5-325 MG per tablet  Sinusitis, chronic - Plan: levofloxacin (LEVAQUIN) 500 MG tablet, Ambulatory referral to ENT  Cough - Plan: benzonatate (TESSALON PERLES) 100 MG capsule  Dehydration - One liter NS IV zofran 8mg  po tid prn  Oral candidiasis - Plan: nystatin (MYCOSTATIN) 100000 UNIT/ML suspension  Lysbeth Penner FNP

## 2013-06-01 ENCOUNTER — Telehealth: Payer: Self-pay | Admitting: Family Medicine

## 2013-06-01 ENCOUNTER — Ambulatory Visit: Payer: PRIVATE HEALTH INSURANCE | Admitting: Family Medicine

## 2013-06-02 ENCOUNTER — Telehealth: Payer: Self-pay | Admitting: Family Medicine

## 2013-06-02 NOTE — Telephone Encounter (Signed)
Message copied by Waverly Ferrari on Wed Jun 02, 2013  3:23 PM ------      Message from: Lysbeth Penner      Created: Mon May 31, 2013  7:05 PM       Need to repeat cxr with nipple markers to exclude pulmonary nodules ------

## 2013-06-03 NOTE — Telephone Encounter (Signed)
Patient aware and will come in on Friday march 20th around 2:00 to repeat xray

## 2013-06-03 NOTE — Telephone Encounter (Signed)
Patient has been called several times and phone say she is not taking calls at this time. So we called home phone and left message to call back if still needs to be seen

## 2013-06-04 ENCOUNTER — Ambulatory Visit (INDEPENDENT_AMBULATORY_CARE_PROVIDER_SITE_OTHER): Payer: PRIVATE HEALTH INSURANCE

## 2013-06-04 ENCOUNTER — Other Ambulatory Visit: Payer: PRIVATE HEALTH INSURANCE

## 2013-06-04 ENCOUNTER — Other Ambulatory Visit: Payer: Self-pay | Admitting: Family Medicine

## 2013-06-04 DIAGNOSIS — R911 Solitary pulmonary nodule: Secondary | ICD-10-CM

## 2013-06-28 ENCOUNTER — Other Ambulatory Visit: Payer: Self-pay | Admitting: *Deleted

## 2013-06-28 MED ORDER — BUDESONIDE-FORMOTEROL FUMARATE 160-4.5 MCG/ACT IN AERO: INHALATION_SPRAY | RESPIRATORY_TRACT | Status: DC

## 2013-07-22 ENCOUNTER — Other Ambulatory Visit: Payer: Self-pay | Admitting: Family Medicine

## 2013-07-23 NOTE — Telephone Encounter (Signed)
een 05/31/13  B Oxford  Last lipid 10/29

## 2013-08-05 ENCOUNTER — Other Ambulatory Visit: Payer: Self-pay | Admitting: *Deleted

## 2013-08-05 DIAGNOSIS — J322 Chronic ethmoidal sinusitis: Secondary | ICD-10-CM

## 2013-08-05 MED ORDER — FLUTICASONE PROPIONATE 50 MCG/ACT NA SUSP: 2.0000 | Freq: Every day | NASAL | Status: DC

## 2013-10-03 ENCOUNTER — Other Ambulatory Visit: Payer: Self-pay | Admitting: Family Medicine

## 2013-12-09 ENCOUNTER — Other Ambulatory Visit: Payer: Self-pay | Admitting: Family Medicine

## 2014-02-17 ENCOUNTER — Other Ambulatory Visit: Payer: Self-pay | Admitting: Family Medicine

## 2014-03-17 ENCOUNTER — Other Ambulatory Visit: Payer: Self-pay | Admitting: Family Medicine

## 2014-04-01 ENCOUNTER — Encounter (INDEPENDENT_AMBULATORY_CARE_PROVIDER_SITE_OTHER): Payer: Self-pay

## 2014-04-01 ENCOUNTER — Ambulatory Visit (INDEPENDENT_AMBULATORY_CARE_PROVIDER_SITE_OTHER): Payer: PRIVATE HEALTH INSURANCE | Admitting: Family Medicine

## 2014-04-01 ENCOUNTER — Encounter: Payer: Self-pay | Admitting: Family Medicine

## 2014-04-01 VITALS — BP 177/105 | HR 85 | Temp 98.9°F | Ht 61.0 in | Wt 93.0 lb

## 2014-04-01 DIAGNOSIS — Z23 Encounter for immunization: Secondary | ICD-10-CM

## 2014-04-01 DIAGNOSIS — E785 Hyperlipidemia, unspecified: Secondary | ICD-10-CM

## 2014-04-01 DIAGNOSIS — I10 Essential (primary) hypertension: Secondary | ICD-10-CM

## 2014-04-01 MED ORDER — AMLODIPINE BESYLATE 5 MG PO TABS: 5.0000 mg | ORAL_TABLET | Freq: Every day | ORAL | Status: DC

## 2014-04-01 MED ORDER — OLMESARTAN MEDOXOMIL-HCTZ 20-12.5 MG PO TABS: 1.0000 | ORAL_TABLET | Freq: Every day | ORAL | Status: DC

## 2014-04-01 NOTE — Progress Notes (Signed)
Subjective:    Patient ID: Mallory [name], female    DOB: Oct 14, 1955, 59 y.o.   MRN: [id number]  HPI  59 year old female comes in today for refill on Benicar. She also complains of hot flashes and painful intercourse. She had a hysterectomy in her 85's and one ovary was left. She has not been on hormone replacement therapy. She has also noticed some changing moles that concern her.   She had been on Benicar 20. That did not control her pressure and it was increased to 40 but that was too strong so now she has been on 20 but again her pressure has not been well controlled. She is off her simvastatin as as she ran out and did not have it refilled.  Patient Active Problem List   Diagnosis Date Noted  . Chronic rhinitis 07/23/2011  . COPD (chronic obstructive pulmonary disease) 04/02/2011  . Smoker 04/02/2011  . Hypertension   . Hyperlipidemia    Outpatient Encounter Prescriptions as of 04/01/2014  Medication Sig  . BENICAR 20 MG tablet TAKE 1 TABLET EVERY DAY  . [DISCONTINUED] albuterol (PROAIR HFA) 108 (90 BASE) MCG/ACT inhaler Inhale 2 puffs into the lungs as needed.    . [DISCONTINUED] amoxicillin-clavulanate (AUGMENTIN) 875-125 MG per tablet Take 1 tablet by mouth 2 (two) times daily.  . [DISCONTINUED] benzonatate (TESSALON PERLES) 100 MG capsule Take 1 capsule (100 mg total) by mouth 3 (three) times daily as needed.  . [DISCONTINUED] budesonide-formoterol (SYMBICORT) 160-4.5 MCG/ACT inhaler TAKE TWO PUFFS FIRST THING IN THE MORNING AND THEN ANOTHER TWO PUFFS 12 HOURS LATER  . [DISCONTINUED] fluticasone (FLONASE) 50 MCG/ACT nasal spray Place 2 sprays into both nostrils daily.  . [DISCONTINUED] HYDROcodone-acetaminophen (NORCO) 5-325 MG per tablet Take 1 tablet by mouth every 6 (six) hours as needed for moderate pain.  . [DISCONTINUED] levofloxacin (LEVAQUIN) 500 MG tablet Take 1 tablet (500 mg total) by mouth daily.  . [DISCONTINUED] montelukast (SINGULAIR) 10 MG tablet TAKE 1 TABLET  (10 MG TOTAL) BY MOUTH AT BEDTIME.  . [DISCONTINUED] nystatin (MYCOSTATIN) 100000 UNIT/ML suspension Take 5 mLs (500,000 Units total) by mouth 4 (four) times daily.  . [DISCONTINUED] olmesartan (BENICAR) 20 MG tablet take 40mg  daily  . [DISCONTINUED] ondansetron (ZOFRAN ODT) 8 MG disintegrating tablet Take 1 tablet (8 mg total) by mouth every 8 (eight) hours as needed for nausea or vomiting.  . [DISCONTINUED] simvastatin (ZOCOR) 10 MG tablet TAKE 1 TABLET AT BEDTIME     Review of Systems  Constitutional: Negative.   HENT: Negative.   Respiratory: Negative.   Cardiovascular: Negative.   Genitourinary: Positive for vaginal pain.  Neurological: Negative.   Psychiatric/Behavioral: Negative.        Objective:   Physical Exam  Constitutional: She is oriented to person, place, and time. She appears well-developed and well-nourished.  Neck: Normal range of motion.  Cardiovascular: Normal rate and regular rhythm.   Pulmonary/Chest: Effort normal.  Abdominal: Soft.  Neurological: She is alert and oriented to person, place, and time.    BP 177/105 mmHg  Pulse 85  Temp(Src) 98.9 F (37.2 C) (Oral)  Ht 5\' 1"  (1.549 m)  Wt 93 lb (42.185 kg)  BMI 17.58 kg/m2       Assessment & Plan:  1. Essential hypertension Add HCTZ and calcium channel blocker and recheck blood pressure in 3-4 weeks - olmesartan-hydrochlorothiazide (BENICAR HCT) 20-12.5 MG per tablet; Take 1 tablet by mouth daily.  Dispense: 30 tablet; Refill: 1 - amLODipine (NORVASC) 5  MG tablet; Take 1 tablet (5 mg total) by mouth daily.  Dispense: 30 tablet; Refill: 1  2. Hyperlipidemia Need to see where her lipids are and whether or not she needs to be on medication - Lipid panel  Wardell Honour MD

## 2014-04-02 LAB — LIPID PANEL
CHOLESTEROL TOTAL: 223 mg/dL — AB (ref 100–199)
Chol/HDL Ratio: 2.1 ratio units (ref 0.0–4.4)
HDL: 106 mg/dL (ref >39)
LDL CALC: 94 mg/dL (ref 0–99)
Triglycerides: 116 mg/dL (ref 0–149)
VLDL Cholesterol Cal: 23 mg/dL (ref 5–40)

## 2014-04-27 ENCOUNTER — Telehealth: Payer: Self-pay | Admitting: Family Medicine

## 2014-04-27 NOTE — Telephone Encounter (Signed)
Appointment given for 2/19 with Sabra Heck.

## 2014-05-06 ENCOUNTER — Encounter: Payer: Self-pay | Admitting: Family Medicine

## 2014-05-06 ENCOUNTER — Ambulatory Visit (INDEPENDENT_AMBULATORY_CARE_PROVIDER_SITE_OTHER): Payer: PRIVATE HEALTH INSURANCE | Admitting: Family Medicine

## 2014-05-06 VITALS — BP 146/95 | HR 83 | Temp 97.9°F | Ht 61.0 in | Wt 91.0 lb

## 2014-05-06 DIAGNOSIS — I10 Essential (primary) hypertension: Secondary | ICD-10-CM

## 2014-05-06 NOTE — Progress Notes (Signed)
   Subjective:    Patient ID: Mallory [name], female    DOB: [birth year], 59 y.o.   MRN: [id number]  HPI  59 year old female who is here to follow-up hypertension. Unfortunately, she ran out of her medicines 24 hours ago so it's hard to really estimate the effectiveness and she has not checked it any out of the office since her last appointment. She seems to be tolerating current regimen which includes Benicar 20 with 12.5 mg of HCTZ and amlodipine 5 mg. Her pressure today has improved but again I doubt that the medicine is really working in her system as if she were taking it full-time and regularly.   Patient Active Problem List   Diagnosis Date Noted  . Chronic rhinitis 07/23/2011  . COPD (chronic obstructive pulmonary disease) 04/02/2011  . Smoker 04/02/2011  . Hypertension   . Hyperlipidemia    Outpatient Encounter Prescriptions as of 05/06/2014  Medication Sig  . amLODipine (NORVASC) 5 MG tablet Take 1 tablet (5 mg total) by mouth daily.  Marland Kitchen olmesartan-hydrochlorothiazide (BENICAR HCT) 20-12.5 MG per tablet Take 1 tablet by mouth daily.  . [DISCONTINUED] BENICAR 20 MG tablet TAKE 1 TABLET EVERY DAY      Review of Systems  Constitutional: Negative.   HENT: Negative.   Eyes: Negative.   Respiratory: Negative.   Cardiovascular: Negative.   Gastrointestinal: Negative.   Endocrine: Negative.   Genitourinary: Negative.   Hematological: Negative.   Psychiatric/Behavioral: Negative.        Objective:   Physical Exam  Constitutional: She is oriented to person, place, and time.  Cardiovascular: Normal rate and regular rhythm.   Pulmonary/Chest: Effort normal and breath sounds normal.  Neurological: She is alert and oriented to person, place, and time.  Psychiatric: She has a normal mood and affect.    BP 146/95 mmHg  Pulse 83  Temp(Src) 97.9 F (36.6 C) (Oral)  Ht 5\' 1"  (1.549 m)  Wt 91 lb (41.277 kg)  BMI 17.20 kg/m2       Assessment & Plan:  1. Essential  hypertension Continue with amlodipine and Benicar/HCTZ Recheck befroe out of me but monitor outside office  Wardell Honour MD

## 2014-06-02 ENCOUNTER — Other Ambulatory Visit: Payer: Self-pay

## 2014-06-02 DIAGNOSIS — I10 Essential (primary) hypertension: Secondary | ICD-10-CM

## 2014-06-02 MED ORDER — OLMESARTAN MEDOXOMIL-HCTZ 20-12.5 MG PO TABS: 1.0000 | ORAL_TABLET | Freq: Every day | ORAL | Status: DC

## 2014-06-02 MED ORDER — AMLODIPINE BESYLATE 5 MG PO TABS: 5.0000 mg | ORAL_TABLET | Freq: Every day | ORAL | Status: DC

## 2014-10-17 ENCOUNTER — Encounter: Payer: Self-pay | Admitting: Family Medicine

## 2014-11-04 ENCOUNTER — Encounter: Payer: Self-pay | Admitting: Family Medicine

## 2014-11-04 ENCOUNTER — Ambulatory Visit (INDEPENDENT_AMBULATORY_CARE_PROVIDER_SITE_OTHER): Payer: PRIVATE HEALTH INSURANCE | Admitting: Family Medicine

## 2014-11-04 VITALS — BP 143/95 | HR 79 | Temp 97.8°F | Ht 61.0 in | Wt 89.8 lb

## 2014-11-04 DIAGNOSIS — Z1211 Encounter for screening for malignant neoplasm of colon: Secondary | ICD-10-CM | POA: Diagnosis not present

## 2014-11-04 DIAGNOSIS — D492 Neoplasm of unspecified behavior of bone, soft tissue, and skin: Secondary | ICD-10-CM | POA: Diagnosis not present

## 2014-11-04 DIAGNOSIS — R634 Abnormal weight loss: Secondary | ICD-10-CM | POA: Diagnosis not present

## 2014-11-04 DIAGNOSIS — I10 Essential (primary) hypertension: Secondary | ICD-10-CM | POA: Diagnosis not present

## 2014-11-04 MED ORDER — METOPROLOL SUCCINATE ER 25 MG PO TB24: 25.0000 mg | ORAL_TABLET | Freq: Every day | ORAL | Status: DC

## 2014-11-04 NOTE — Assessment & Plan Note (Addendum)
Recheck blood pressure. Blood pressure elevated at 143/95. Patient has only been taking metoprolol once a day and has not been taking her Norvasc. We'll resume these medications, we'll change metoprolol to extended release. Will check labs.

## 2014-11-04 NOTE — Progress Notes (Signed)
BP 143/95 mmHg  Pulse 79  Temp(Src) 97.8 F (36.6 C) (Oral)  Ht '5\' 1"'  (1.549 m)  Wt 89 lb 12.8 oz (40.733 kg)  BMI 16.98 kg/m2   Subjective:    Patient ID: Mallory [name], female    DOB: 10/25/1955, 59 y.o.   MRN: [id number]  HPI: Mallory [name] is a 59 y.o. female presenting on 11/04/2014 for Blood pressure recheck; colonoscopy referral; and Concern about growth on right side of neck   HPI Weight loss Patient has been having 20 pound weight loss over the past 3 months. She has had her regular cervical screenings. She has had a colonoscopy previously but is due for repeat again. She is a current every day smoker but denies any cough shortness of breath or hemoptysis. She also denies any chest pain. She takes ensure 2-3 times a day to try and help with the weight loss.  Skin growth  Patient hasn't an normal skin growth on the anterior right side of her neck. She feels like it has been increasing in size over the past 2-3 months and is coming in to have it removed and checked. There is no purulence or exudate or erythema.  Hypertension Patient's blood pressure is elevated today but she does admit to not having taken her medications as prescribed. Patient denies headaches, blurred vision, chest pains, shortness of breath, or weakness. Denies any side effects from medication and is content with current medication.  Relevant past medical, surgical, family and social history reviewed and updated as indicated. Interim medical history since our last visit reviewed. Allergies and medications reviewed and updated.  Review of Systems  Constitutional: Positive for appetite change (early satiety) and unexpected weight change. Negative for fever and chills.  HENT: Negative for congestion, ear discharge, ear pain and rhinorrhea.   Eyes: Negative for pain, redness and visual disturbance.  Respiratory: Negative for chest tightness and shortness of breath.   Cardiovascular: Negative for chest  pain and leg swelling.  Endocrine: Negative for cold intolerance and heat intolerance.  Genitourinary: Negative for dysuria and difficulty urinating.  Musculoskeletal: Negative for back pain and gait problem.  Skin: Positive for color change (skin lesion). Negative for rash.  Neurological: Negative for dizziness, syncope, light-headedness, numbness and headaches.  Psychiatric/Behavioral: Negative for behavioral problems and agitation.  All other systems reviewed and are negative.   Per HPI unless specifically indicated above     Medication List       This list is accurate as of: 11/04/14  3:02 PM.  Always use your most recent med list.               amLODipine 5 MG tablet  Commonly known as:  NORVASC  Take 1 tablet (5 mg total) by mouth daily.     olmesartan-hydrochlorothiazide 20-12.5 MG per tablet  Commonly known as:  BENICAR HCT  Take 1 tablet by mouth daily.           Objective:    BP 143/95 mmHg  Pulse 79  Temp(Src) 97.8 F (36.6 C) (Oral)  Ht '5\' 1"'  (1.549 m)  Wt 89 lb 12.8 oz (40.733 kg)  BMI 16.98 kg/m2  Wt Readings from Last 3 Encounters:  11/04/14 89 lb 12.8 oz (40.733 kg)  05/06/14 91 lb (41.277 kg)  04/01/14 93 lb (42.185 kg)    Physical Exam  Constitutional: She is oriented to person, place, and time. Vital signs are normal. She appears well-nourished. No distress.  Eyes: Conjunctivae and  EOM are normal. Pupils are equal, round, and reactive to light.  Neck: No thyromegaly present.  Cardiovascular: Normal rate, regular rhythm and normal heart sounds.   No murmur heard. Pulmonary/Chest: Effort normal and breath sounds normal. No respiratory distress. She has no wheezes.  Abdominal: Soft. Bowel sounds are normal. She exhibits no distension. There is no tenderness.  Musculoskeletal: Normal range of motion. She exhibits no edema or tenderness.  Lymphadenopathy:    She has no cervical adenopathy.  Neurological: She is alert and oriented to person,  place, and time. Coordination normal.  Skin: Skin is warm and dry. Lesion (round pink papuleon anterior right neck, very well could be an atypical nevus) noted. No rash noted. She is not diaphoretic.  Psychiatric: She has a normal mood and affect. Her behavior is normal.  Vitals reviewed.   Results for orders placed or performed in visit on 04/01/14  Lipid panel  Result Value Ref Range   Cholesterol, Total 223 (H) 100 - 199 mg/dL   Triglycerides 116 0 - 149 mg/dL   HDL 106 >39 mg/dL   VLDL Cholesterol Cal 23 5 - 40 mg/dL   LDL Calculated 94 0 - 99 mg/dL   Chol/HDL Ratio 2.1 0.0 - 4.4 ratio units      Assessment & Plan:   Problem List Items Addressed This Visit      Cardiovascular and Mediastinum   Hypertension - Primary    Recheck blood pressure. Blood pressure elevated at 143/95. Patient has only been taking metoprolol once a day and has not been taking her Norvasc. We'll resume these medications, we'll change metoprolol to extended release. Will check labs.      Relevant Medications   metoprolol succinate (TOPROL-XL) 25 MG 24 hr tablet   Other Relevant Orders   CMP14+EGFR (Completed)     Musculoskeletal and Integument   Abnormal skin growth    Patient has abnormal skin growth on anterior neck. It appears to be an enlarged mole. Because of the nature and sensitivity of the region will refer to dermatology.      Relevant Orders   Ambulatory referral to Dermatology     Other   Encounter for screening colonoscopy   Relevant Orders   Ambulatory referral to Gastroenterology   Loss of weight    Patient has been having weight loss over the past few months. She is tempted to use ensure but she does have an early satiety. We'll test TSH, basic metabolic panel and a CBC. We'll do referral for colonoscopy to gastroenterology. If all labs are normal then we'll recommend for her to discuss weight loss with them.      Relevant Orders   CBC with Differential/Platelet   CMP14+EGFR  (Completed)   Thyroid Panel With TSH (Completed)       Follow up plan: Return in about 4 weeks (around 12/02/2014), or if symptoms worsen or fail to improve, for hypertension recheck.  Caryl Pina, MD [facility] 11/04/2014, 3:02 PM

## 2014-11-04 NOTE — Assessment & Plan Note (Addendum)
Patient has been having weight loss over the past few months. She is tempted to use ensure but she does have an early satiety. We'll test TSH, basic metabolic panel and a CBC. We'll do referral for colonoscopy to gastroenterology. If all labs are normal then we'll recommend for her to discuss weight loss with them.

## 2014-11-05 LAB — CMP14+EGFR
ALBUMIN: 4.8 g/dL (ref 3.5–5.5)
ALK PHOS: 59 IU/L (ref 39–117)
ALT: 20 IU/L (ref 0–32)
AST: 21 IU/L (ref 0–40)
Albumin/Globulin Ratio: 1.8 (ref 1.1–2.5)
BILIRUBIN TOTAL: 0.7 mg/dL (ref 0.0–1.2)
BUN / CREAT RATIO: 18 (ref 9–23)
BUN: 13 mg/dL (ref 6–24)
CHLORIDE: 97 mmol/L (ref 97–108)
CO2: 24 mmol/L (ref 18–29)
Calcium: 10.2 mg/dL (ref 8.7–10.2)
Creatinine, Ser: 0.72 mg/dL (ref 0.57–1.00)
GFR calc Af Amer: 107 mL/min/{1.73_m2} (ref >59)
GFR calc non Af Amer: 93 mL/min/{1.73_m2} (ref >59)
GLOBULIN, TOTAL: 2.7 g/dL (ref 1.5–4.5)
GLUCOSE: 86 mg/dL (ref 65–99)
Potassium: 4.2 mmol/L (ref 3.5–5.2)
SODIUM: 138 mmol/L (ref 134–144)
Total Protein: 7.5 g/dL (ref 6.0–8.5)

## 2014-11-05 LAB — THYROID PANEL WITH TSH
Free Thyroxine Index: 2.6 (ref 1.2–4.9)
T3 Uptake Ratio: 31 % (ref 24–39)
T4 TOTAL: 8.5 ug/dL (ref 4.5–12.0)
TSH: 2.64 u[IU]/mL (ref 0.450–4.500)

## 2014-11-05 LAB — CBC WITH DIFFERENTIAL/PLATELET
BASOS: 0 %
Basophils Absolute: 0 10*3/uL (ref 0.0–0.2)
EOS (ABSOLUTE): 0.1 10*3/uL (ref 0.0–0.4)
EOS: 1 %
HEMATOCRIT: 38.2 % (ref 34.0–46.6)
HEMOGLOBIN: 13.7 g/dL (ref 11.1–15.9)
Immature Grans (Abs): 0 10*3/uL (ref 0.0–0.1)
Immature Granulocytes: 0 %
LYMPHS ABS: 3.6 10*3/uL — AB (ref 0.7–3.1)
Lymphs: 52 %
MCH: 32.8 pg (ref 26.6–33.0)
MCHC: 35.9 g/dL — AB (ref 31.5–35.7)
MCV: 91 fL (ref 79–97)
MONOCYTES: 7 %
Monocytes Absolute: 0.5 10*3/uL (ref 0.1–0.9)
NEUTROS ABS: 2.8 10*3/uL (ref 1.4–7.0)
Neutrophils: 40 %
Platelets: 331 10*3/uL (ref 150–379)
RBC: 4.18 x10E6/uL (ref 3.77–5.28)
RDW: 12.9 % (ref 12.3–15.4)
WBC: 7 10*3/uL (ref 3.4–10.8)

## 2014-11-07 NOTE — Assessment & Plan Note (Signed)
Patient has abnormal skin growth on anterior neck. It appears to be an enlarged mole. Because of the nature and sensitivity of the region will refer to dermatology.

## 2014-11-17 ENCOUNTER — Other Ambulatory Visit: Payer: Self-pay | Admitting: *Deleted

## 2014-11-17 DIAGNOSIS — I10 Essential (primary) hypertension: Secondary | ICD-10-CM

## 2014-11-17 MED ORDER — AMLODIPINE BESYLATE 5 MG PO TABS: 5.0000 mg | ORAL_TABLET | Freq: Every day | ORAL | Status: DC

## 2014-11-17 MED ORDER — OLMESARTAN MEDOXOMIL-HCTZ 20-12.5 MG PO TABS: 1.0000 | ORAL_TABLET | Freq: Every morning | ORAL | Status: DC

## 2014-12-06 ENCOUNTER — Ambulatory Visit (INDEPENDENT_AMBULATORY_CARE_PROVIDER_SITE_OTHER): Payer: PRIVATE HEALTH INSURANCE | Admitting: Family Medicine

## 2014-12-06 ENCOUNTER — Encounter: Payer: Self-pay | Admitting: Family Medicine

## 2014-12-06 VITALS — BP 131/88 | HR 80 | Temp 99.2°F | Ht 61.0 in | Wt 89.6 lb

## 2014-12-06 DIAGNOSIS — J209 Acute bronchitis, unspecified: Secondary | ICD-10-CM

## 2014-12-06 DIAGNOSIS — Z716 Tobacco abuse counseling: Secondary | ICD-10-CM

## 2014-12-06 DIAGNOSIS — Z720 Tobacco use: Secondary | ICD-10-CM

## 2014-12-06 DIAGNOSIS — I10 Essential (primary) hypertension: Secondary | ICD-10-CM

## 2014-12-06 MED ORDER — AZITHROMYCIN 250 MG PO TABS: ORAL_TABLET | ORAL | Status: DC

## 2014-12-06 MED ORDER — VARENICLINE TARTRATE 0.5 MG X 11 & 1 MG X 42 PO MISC: ORAL | Status: DC

## 2014-12-06 MED ORDER — ALBUTEROL SULFATE HFA 108 (90 BASE) MCG/ACT IN AERS: 2.0000 | INHALATION_SPRAY | Freq: Four times a day (QID) | RESPIRATORY_TRACT | Status: DC | PRN

## 2014-12-06 MED ORDER — VARENICLINE TARTRATE 1 MG PO TABS: 1.0000 mg | ORAL_TABLET | Freq: Two times a day (BID) | ORAL | Status: DC

## 2014-12-06 NOTE — Progress Notes (Signed)
BP 131/88 mmHg  Pulse 80  Temp(Src) 99.2 F (37.3 C) (Oral)  Ht '5\' 1"'  (1.549 m)  Wt 89 lb 9.6 oz (40.642 kg)  BMI 16.94 kg/m2   Subjective:    Patient ID: Mallory [name], female    DOB: 30-Mar-1955, 59 y.o.   MRN: [id number]  HPI: KRISSA UTKE is a 59 y.o. female presenting on 12/06/2014 for Hypertension; Cough; and Chest congestion   HPI Chest congestion and cough Patient has been having chest congestion and cough and worsening over the past couple days. It initially started out with nasal drainage and congestion and sinus pressure and a cough. She has been using Mucinex at home but over the last couple days has developed into congestion that is more down in her chest and is having difficulty coughing it out like she was before. She denies any fevers or chills but does admit to having some occasional wheezing. She has a rescue inhaler but does not know if it's outdated and has not been using it.  Hypertension Patient presents today for hypertension recheck after 4 weeks. She has been taking her medications of Benicar, metoprolol, Norvasc without any side effects. Her blood pressure today is 131/88. She denies any headaches, weakness, chest pain.  Smoking cessation Patient because of upper respiratory infection is ready to quit smoking. She currently smokes about 7-8 cigarettes a day just down a lot from what she used to smoke. Discussed all of the options and she would like to try Chantix.  Relevant past medical, surgical, family and social history reviewed and updated as indicated. Interim medical history since our last visit reviewed. Allergies and medications reviewed and updated.  Review of Systems  Constitutional: Negative for fever and chills.  HENT: Positive for congestion, postnasal drip, sinus pressure and sore throat. Negative for ear discharge, ear pain, rhinorrhea and voice change.   Eyes: Negative for pain, redness and visual disturbance.  Respiratory: Positive  for cough, shortness of breath and wheezing. Negative for chest tightness.   Cardiovascular: Negative for chest pain, palpitations and leg swelling.  Genitourinary: Negative for dysuria and difficulty urinating.  Musculoskeletal: Negative for back pain and gait problem.  Skin: Negative for rash.  Neurological: Negative for dizziness, weakness, light-headedness, numbness and headaches.  Psychiatric/Behavioral: Negative for behavioral problems and agitation.  All other systems reviewed and are negative.   Per HPI unless specifically indicated above     Medication List       This list is accurate as of: 12/06/14  2:25 PM.  Always use your most recent med list.               albuterol 108 (90 BASE) MCG/ACT inhaler  Commonly known as:  PROVENTIL HFA;VENTOLIN HFA  Inhale 2 puffs into the lungs every 6 (six) hours as needed for wheezing or shortness of breath.     amLODipine 5 MG tablet  Commonly known as:  NORVASC  Take 1 tablet (5 mg total) by mouth daily.     azithromycin 250 MG tablet  Commonly known as:  ZITHROMAX  Take 2 the first day and then one each day after.     metoprolol succinate 25 MG 24 hr tablet  Commonly known as:  TOPROL-XL  Take 1 tablet (25 mg total) by mouth daily.     olmesartan-hydrochlorothiazide 20-12.5 MG per tablet  Commonly known as:  BENICAR HCT  Take 1 tablet by mouth every morning.     varenicline 0.5 MG X 11 &  1 MG X 42 tablet  Commonly known as:  CHANTIX STARTING MONTH PAK  Take one 0.5 mg tablet by mouth once daily for 3 days, then increase to one 0.5 mg tablet twice daily for 4 days, then increase to one 1 mg tablet twice daily.     varenicline 1 MG tablet  Commonly known as:  CHANTIX CONTINUING MONTH PAK  Take 1 tablet (1 mg total) by mouth 2 (two) times daily.           Objective:    BP 131/88 mmHg  Pulse 80  Temp(Src) 99.2 F (37.3 C) (Oral)  Ht '5\' 1"'  (1.549 m)  Wt 89 lb 9.6 oz (40.642 kg)  BMI 16.94 kg/m2  Wt Readings  from Last 3 Encounters:  12/06/14 89 lb 9.6 oz (40.642 kg)  11/04/14 89 lb 12.8 oz (40.733 kg)  05/06/14 91 lb (41.277 kg)    Physical Exam  Constitutional: She is oriented to person, place, and time. She appears well-developed and well-nourished. No distress.  HENT:  Right Ear: Tympanic membrane, external ear and ear canal normal.  Left Ear: Tympanic membrane, external ear and ear canal normal.  Nose: Mucosal edema present. No rhinorrhea. No epistaxis. Right sinus exhibits no maxillary sinus tenderness and no frontal sinus tenderness. Left sinus exhibits no maxillary sinus tenderness and no frontal sinus tenderness.  Mouth/Throat: Uvula is midline and mucous membranes are normal. Posterior oropharyngeal edema and posterior oropharyngeal erythema present. No oropharyngeal exudate or tonsillar abscesses.  Eyes: Conjunctivae and EOM are normal. Pupils are equal, round, and reactive to light.  Neck: Neck supple. No thyromegaly present.  Cardiovascular: Normal rate, regular rhythm, normal heart sounds and intact distal pulses.   No murmur heard. Pulmonary/Chest: Effort normal and breath sounds normal. No respiratory distress. She has no wheezes.  Musculoskeletal: Normal range of motion. She exhibits no edema or tenderness.  Lymphadenopathy:    She has no cervical adenopathy.  Neurological: She is alert and oriented to person, place, and time. Coordination normal.  Skin: Skin is warm and dry. No rash noted. She is not diaphoretic.  Psychiatric: She has a normal mood and affect. Her behavior is normal.  Vitals reviewed.   Results for orders placed or performed in visit on 11/04/14  CMP14+EGFR  Result Value Ref Range   Glucose 86 65 - 99 mg/dL   BUN 13 6 - 24 mg/dL   Creatinine, Ser 0.72 0.57 - 1.00 mg/dL   GFR calc non Af Amer 93 >59 mL/min/1.73   GFR calc Af Amer 107 >59 mL/min/1.73   BUN/Creatinine Ratio 18 9 - 23   Sodium 138 134 - 144 mmol/L   Potassium 4.2 3.5 - 5.2 mmol/L    Chloride 97 97 - 108 mmol/L   CO2 24 18 - 29 mmol/L   Calcium 10.2 8.7 - 10.2 mg/dL   Total Protein 7.5 6.0 - 8.5 g/dL   Albumin 4.8 3.5 - 5.5 g/dL   Globulin, Total 2.7 1.5 - 4.5 g/dL   Albumin/Globulin Ratio 1.8 1.1 - 2.5   Bilirubin Total 0.7 0.0 - 1.2 mg/dL   Alkaline Phosphatase 59 39 - 117 IU/L   AST 21 0 - 40 IU/L   ALT 20 0 - 32 IU/L  Thyroid Panel With TSH  Result Value Ref Range   TSH 2.640 0.450 - 4.500 uIU/mL   T4, Total 8.5 4.5 - 12.0 ug/dL   T3 Uptake Ratio 31 24 - 39 %   Free Thyroxine Index 2.6 1.2 -  4.9  CBC with Differential/Platelet  Result Value Ref Range   WBC 7.0 3.4 - 10.8 x10E3/uL   RBC 4.18 3.77 - 5.28 x10E6/uL   Hemoglobin 13.7 11.1 - 15.9 g/dL   Hematocrit 38.2 34.0 - 46.6 %   MCV 91 79 - 97 fL   MCH 32.8 26.6 - 33.0 pg   MCHC 35.9 (H) 31.5 - 35.7 g/dL   RDW 12.9 12.3 - 15.4 %   Platelets 331 150 - 379 x10E3/uL   Neutrophils 40 %   Lymphs 52 %   Monocytes 7 %   Eos 1 %   Basos 0 %   Neutrophils Absolute 2.8 1.4 - 7.0 x10E3/uL   Lymphocytes Absolute 3.6 (H) 0.7 - 3.1 x10E3/uL   Monocytes Absolute 0.5 0.1 - 0.9 x10E3/uL   EOS (ABSOLUTE) 0.1 0.0 - 0.4 x10E3/uL   Basophils Absolute 0.0 0.0 - 0.2 x10E3/uL   Immature Granulocytes 0 %   Immature Grans (Abs) 0.0 0.0 - 0.1 x10E3/uL      Assessment & Plan:   Problem List Items Addressed This Visit      Cardiovascular and Mediastinum   Hypertension    BP much improved to 131/88 today. Continue medications.       Other Visit Diagnoses    Encounter for smoking cessation counseling    -  Primary    Patient smokes about 7 cigarettes per day and is ready to quit and wants to try Chantix.    Relevant Medications    varenicline (CHANTIX STARTING MONTH PAK) 0.5 MG X 11 & 1 MG X 42 tablet    varenicline (CHANTIX CONTINUING MONTH PAK) 1 MG tablet    Acute bronchitis, unspecified organism        Patient has symptoms of bronchitis, likely from COPD and smoking. Sent rescue albuterol and azithromycin.     Relevant Medications    albuterol (PROVENTIL HFA;VENTOLIN HFA) 108 (90 BASE) MCG/ACT inhaler    azithromycin (ZITHROMAX) 250 MG tablet        Follow up plan: Return in about 3 months (around 03/07/2015), or if symptoms worsen or fail to improve, for Hypertension and COPD.  Caryl Pina, MD [facility] 12/06/2014, 2:25 PM

## 2014-12-06 NOTE — Assessment & Plan Note (Signed)
BP much improved to 131/88 today. Continue medications.

## 2014-12-20 ENCOUNTER — Telehealth: Payer: Self-pay

## 2014-12-20 NOTE — Telephone Encounter (Signed)
[employer] insurance denied Chantix  Must have hx of failure to bupropion

## 2014-12-21 MED ORDER — BUPROPION HCL ER (SR) 150 MG PO TB12: 150.0000 mg | ORAL_TABLET | Freq: Two times a day (BID) | ORAL | Status: DC

## 2014-12-21 NOTE — Telephone Encounter (Signed)
Pt agrees with the change of medication.

## 2014-12-21 NOTE — Telephone Encounter (Signed)
Please call patient and ask her if she would like to try bupropion. If she does want to try then sent a prescription for her. Initial: 150 mg once daily for 3 days; increase to 150 mg twice daily; treatment should continue for 7 to 12 weeks. Tell her we discussed this in the visit and it is the antidepressant medication at a low dose. Caryl Pina, MD [facility] 12/21/2014, 8:23 AM

## 2014-12-21 NOTE — Addendum Note (Signed)
Addended by: Jamelle Haring on: 12/21/2014 12:17 PM   Modules accepted: Orders

## 2015-01-26 IMAGING — CT CT MAXILLOFACIAL W/O CM
3 of 4 series · 16 of 47 positions shown, 19 images · non-contrast
Comparison: None

CLINICAL DATA: Headache, chronic sinusitis, cough knots at medial
right orbit and posterior to left ear, swelling at right-side of
face

EXAM:
CT MAXILLOFACIAL WITHOUT CONTRAST
TECHNIQUE: Multidetector CT imaging of the maxillofacial structures was
performed. Multiplanar CT image reconstructions were also generated.
A small vitamin-E capsule was placed on the right temple in order to
reliably differentiate right from left.

[Series 3: maxillofacial 2.0 h30s st · axial · 0.32mm/px · z∈[-211,-85]mm · 11 of 75 slices shown, 14 images]
[im 6/75  brain]
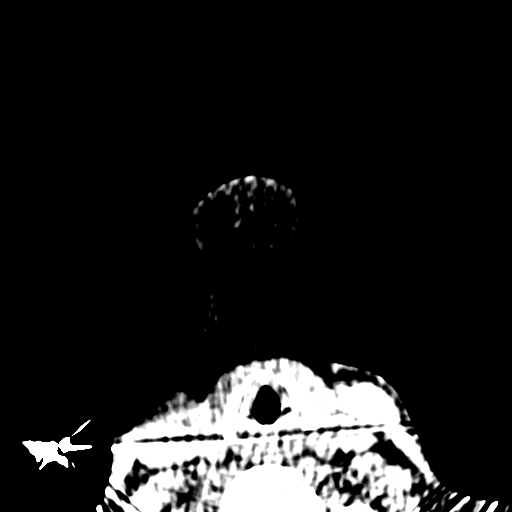
[im 6/75  bone]
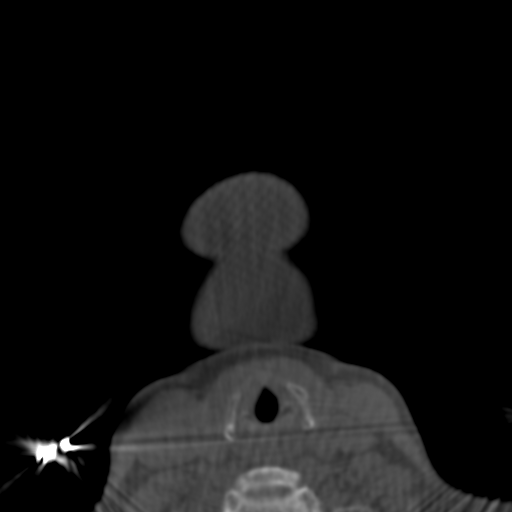
[im 11/75  bone]
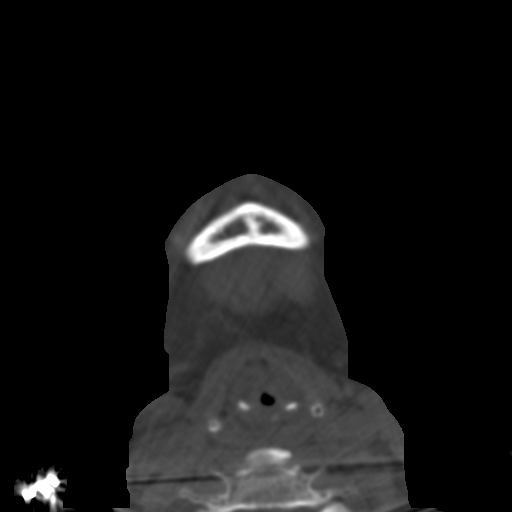
[im 18/75  bone]
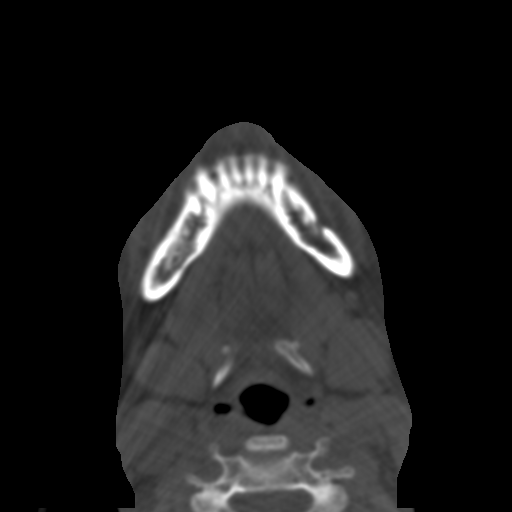
[im 23/75  bone]
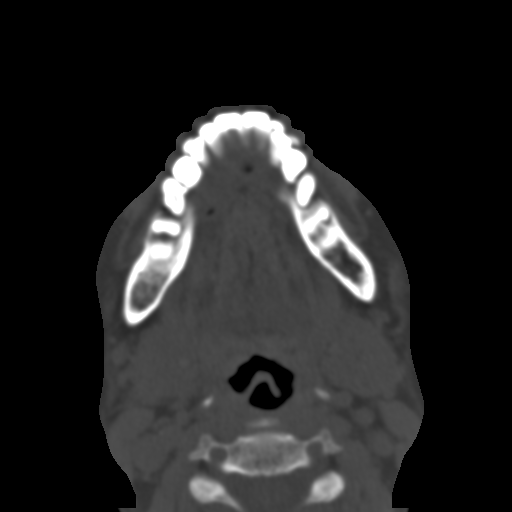
[im 31/75  brain]
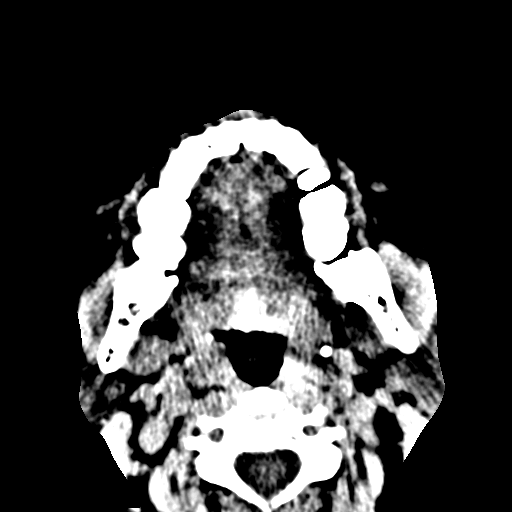
[im 31/75  bone]
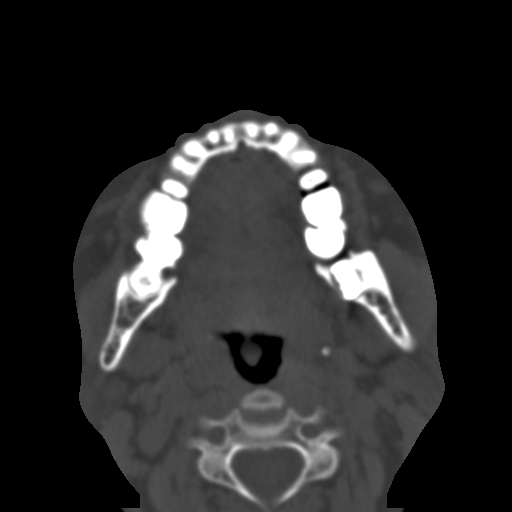
[im 39/75  bone]
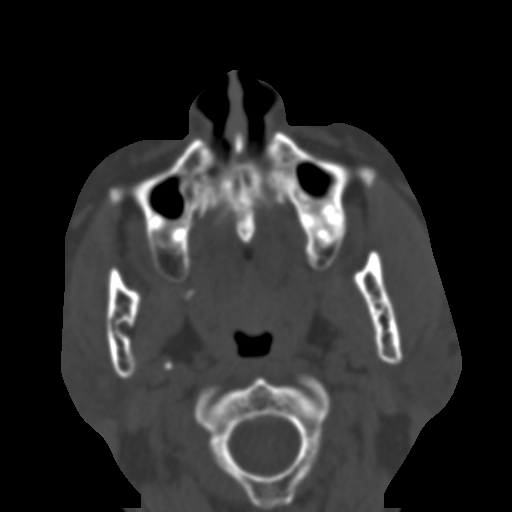
[im 44/75  bone]
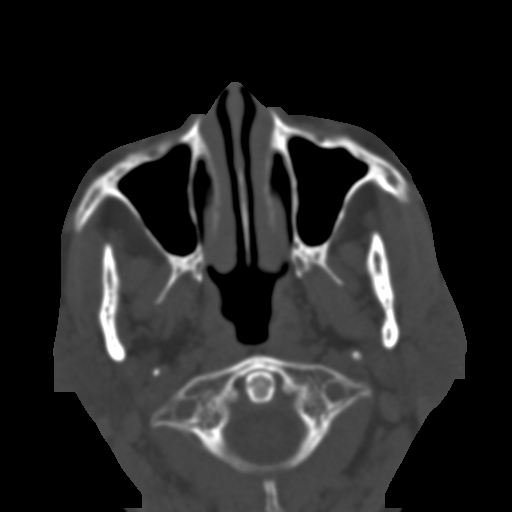
[im 52/75  bone]
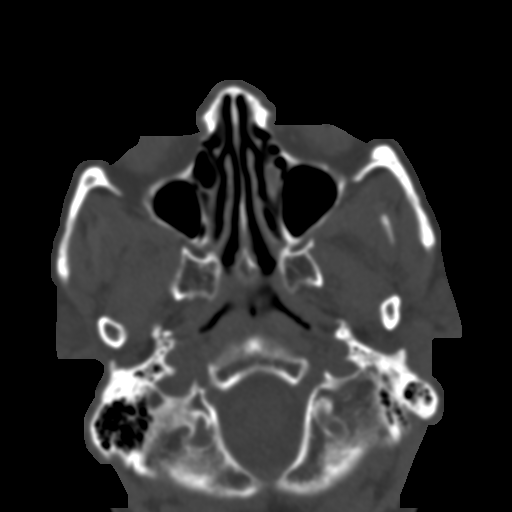
[im 57/75  brain]
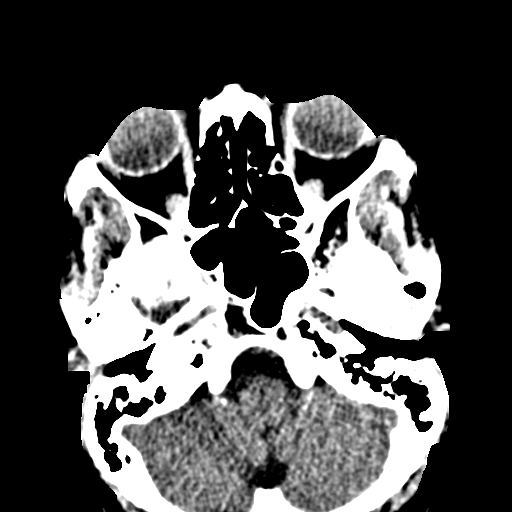
[im 57/75  bone]
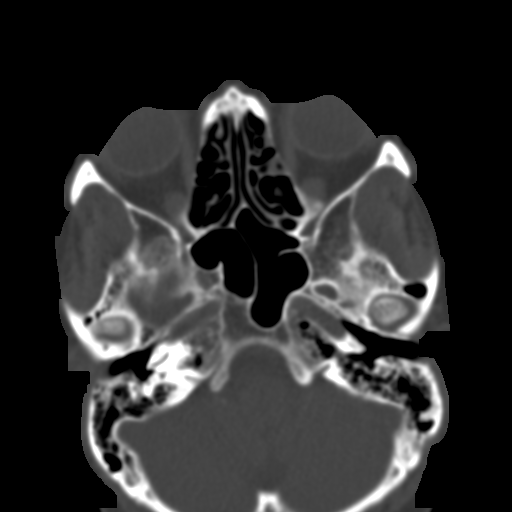
[im 64/75  bone]
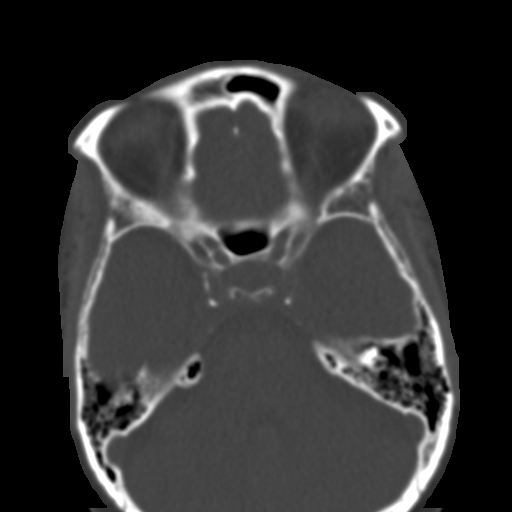
[im 69/75  bone]
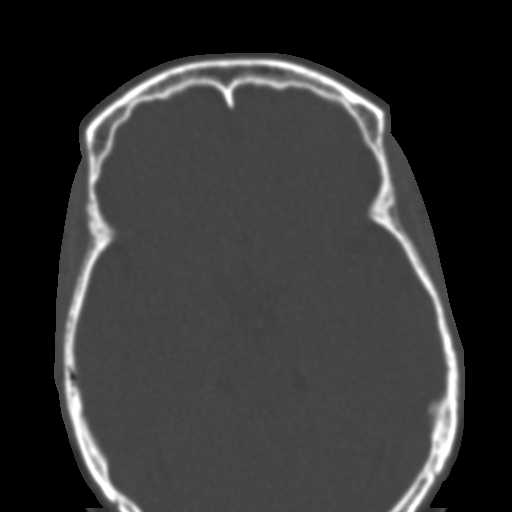

[Series 7: maxillofacial 2.0 sagittal · sagittal · 0.33mm/px · 2 of 72 slices shown]
[im 24/72  bone]
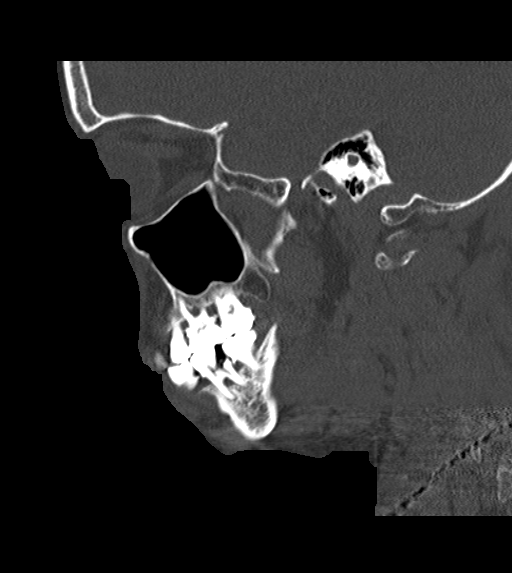
[im 48/72  bone]
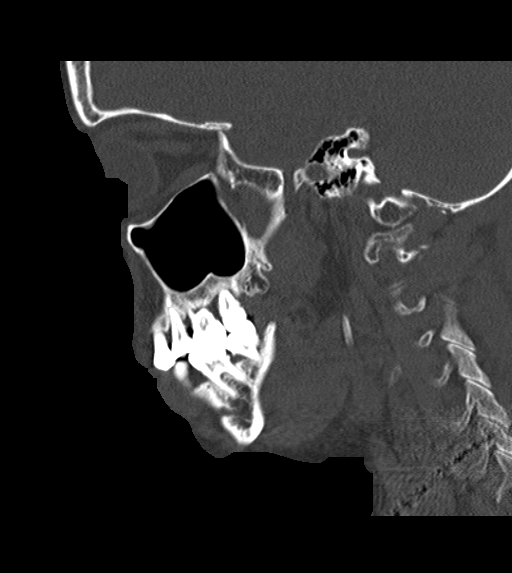

[Series 8: maxillofacial 2.0 coronal · coronal · 0.33mm/px · 3 of 74 slices shown]
[im 25/74  bone]
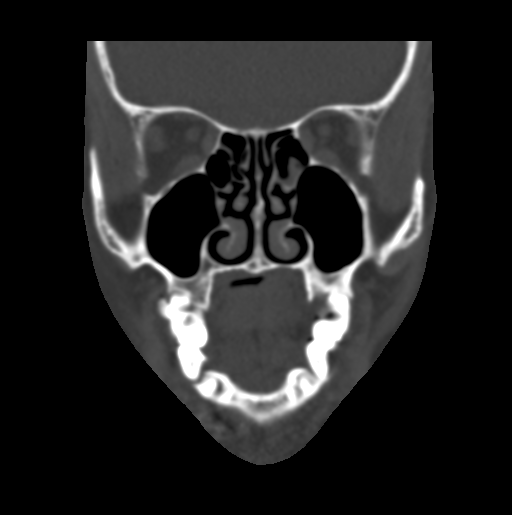
[im 33/74  bone]
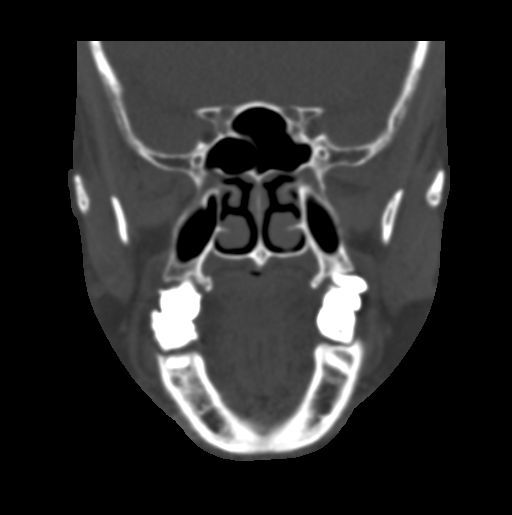
[im 41/74  bone]
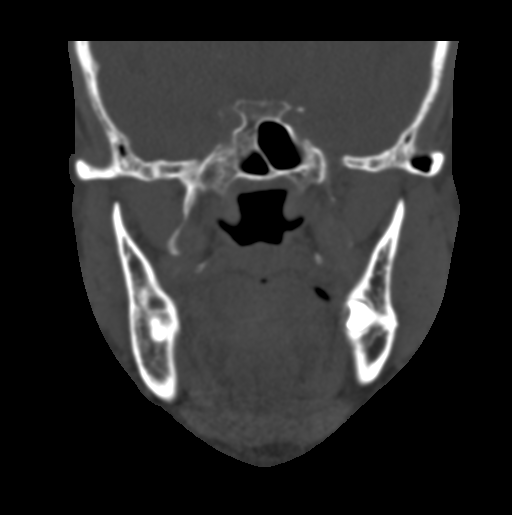

[16 of 47 positions shown; findings below may reference images not displayed]

FINDINGS: Few atherosclerotic calcifications at carotid siphons.

Mastoid air cells and middle ear cavities clear.

Nasal septum fairly midline.

Minimal opacification of a few left ethmoid air cells.

Paranasal sinuses otherwise clear.

No fracture or bone destruction.

Intraorbital and facial soft tissue planes clear.

No significant intracranial abnormalities identified.

Visualized portion of cervical spine normal appearance.
IMPRESSION: Minimal left ethmoid sinus disease.

Otherwise negative exam of the paranasal sinuses.

Few atherosclerotic calcifications at the carotid siphons.

## 2015-02-02 IMAGING — CR DG CHEST 1V
1 series · 1 of 1 positions shown · non-contrast
Comparison: 05/31/2013

CLINICAL DATA: Follow-up with nipple markers

EXAM:
CHEST - 1 VIEW

[view not recorded]
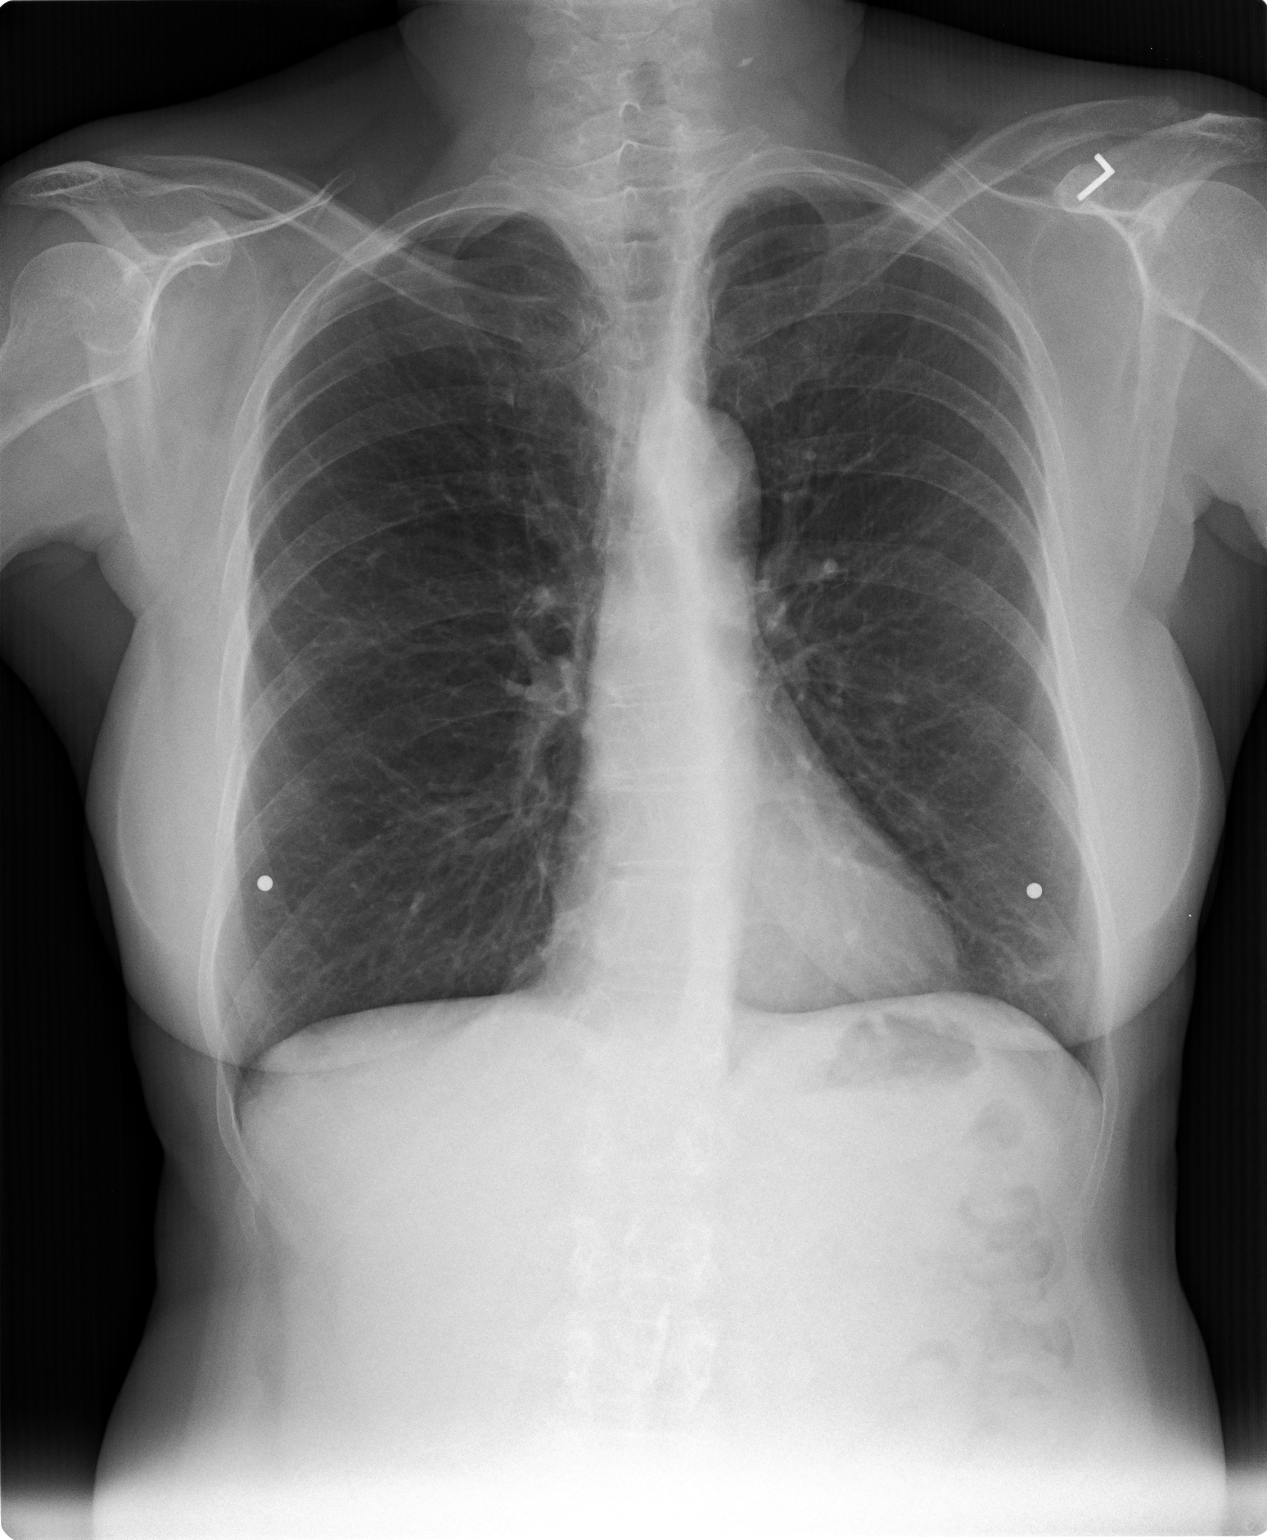

[1 of 1 positions shown; findings below may reference images not displayed]

FINDINGS: Cardiomediastinal silhouette is stable. Bilateral nipple markers
noted. No acute infiltrate or pulmonary edema. Previous nodular
density left base corresponds to the nipple. No lung nodule.
IMPRESSION: No active disease.  No lung nodule.  Bilateral nipple markers.

## 2015-03-09 ENCOUNTER — Ambulatory Visit: Payer: PRIVATE HEALTH INSURANCE | Admitting: Family Medicine

## 2015-03-10 ENCOUNTER — Encounter: Payer: Self-pay | Admitting: Family Medicine

## 2015-05-14 ENCOUNTER — Other Ambulatory Visit: Payer: Self-pay | Admitting: Family Medicine

## 2015-06-06 ENCOUNTER — Telehealth: Payer: Self-pay | Admitting: Family Medicine

## 2015-06-22 ENCOUNTER — Ambulatory Visit (INDEPENDENT_AMBULATORY_CARE_PROVIDER_SITE_OTHER): Payer: Managed Care, Other (non HMO) | Admitting: Nurse Practitioner

## 2015-06-22 ENCOUNTER — Encounter: Payer: Self-pay | Admitting: *Deleted

## 2015-06-22 ENCOUNTER — Encounter: Payer: Self-pay | Admitting: Nurse Practitioner

## 2015-06-22 ENCOUNTER — Encounter (INDEPENDENT_AMBULATORY_CARE_PROVIDER_SITE_OTHER): Payer: Self-pay

## 2015-06-22 VITALS — BP 142/81 | HR 108 | Temp 97.8°F | Ht 61.0 in | Wt 87.0 lb

## 2015-06-22 DIAGNOSIS — K029 Dental caries, unspecified: Secondary | ICD-10-CM

## 2015-06-22 MED ORDER — HYDROCODONE-ACETAMINOPHEN 5-325 MG PO TABS: 1.0000 | ORAL_TABLET | Freq: Four times a day (QID) | ORAL | Status: DC | PRN

## 2015-06-22 MED ORDER — CLINDAMYCIN HCL 300 MG PO CAPS: 300.0000 mg | ORAL_CAPSULE | Freq: Four times a day (QID) | ORAL | Status: DC

## 2015-06-22 NOTE — Progress Notes (Signed)
   Subjective:    Patient ID: Mallory [name], female    DOB: Jul 16, 1955, 60 y.o.   MRN: [id number]  HPI patient here today with a complain of acute pain in her mouth and bilateral jaw. Symptom started with head congestion, head fullness, pain in between her eyes and back of her throat. Symptoms started about a week ago and gotten worse since Monday.Patient report having sever pain in the bottom of her mouth and bilateral jaw area. She rated the pain a 10/10,  Eating and brushing of teeth makes the pain worse, she has tried some over the counter pain med without any relief.     Review of Systems  Constitutional: Positive for fever, chills and appetite change.  HENT: Positive for congestion, dental problem, facial swelling, sinus pressure, sore throat and trouble swallowing. Negative for ear pain and mouth sores.   Eyes: Negative.   Respiratory: Negative.  Negative for cough, chest tightness, shortness of breath and wheezing.   Cardiovascular: Negative.   Gastrointestinal: Negative.  Negative for nausea and vomiting.  Musculoskeletal: Negative.        Objective:   Physical Exam  Constitutional: She is oriented to person, place, and time. She appears distressed.  HENT:  Head: Normocephalic.  Right Ear: External ear normal.  Left Ear: External ear normal.  Unable to open mouth wide, upper and lower teeth black and brown, gums swollen and red. Bilateral jaw area swollen and tender to tourch  Eyes: Pupils are equal, round, and reactive to light.  Neck: Normal range of motion. No tracheal deviation present. No thyromegaly present.  Cardiovascular: Normal rate, regular rhythm and normal heart sounds.   Pulmonary/Chest: Effort normal and breath sounds normal.  Lymphadenopathy:    She has cervical adenopathy.  Neurological: She is alert and oriented to person, place, and time.  Skin: Skin is warm and dry.  Psychiatric: She has a normal mood and affect. Her behavior is normal.    BP  142/81 mmHg  Pulse 108  Temp(Src) 97.8 F (36.6 C) (Oral)  Ht 5\' 1"  (1.549 m)  Wt 87 lb (39.463 kg)  BMI 16.45 kg/m2       Assessment & Plan:  1. Dental caries Gargle with salt water,  make an appoinment with a dentist as soon as possible Meds ordered this encounter  Medications  . clindamycin (CLEOCIN) 300 MG capsule    Sig: Take 1 capsule (300 mg total) by mouth 4 (four) times daily.    Dispense:  40 capsule    Refill:  0  . HYDROcodone-acetaminophen (LORTAB) 5-325 MG tablet    Sig: Take 1 tablet by mouth every 6 (six) hours as needed for moderate pain.    Dispense:  40 tablet    Refill:  0    Mary-Margaret Hassell Done, FNP

## 2015-06-22 NOTE — Patient Instructions (Signed)

## 2015-06-22 NOTE — Addendum Note (Signed)
Addended by: Chevis Pretty on: 06/22/2015 11:44 AM   Modules accepted: Orders

## 2015-12-07 ENCOUNTER — Other Ambulatory Visit: Payer: Self-pay | Admitting: Family Medicine

## 2015-12-09 ENCOUNTER — Other Ambulatory Visit: Payer: Self-pay | Admitting: Family Medicine

## 2015-12-09 DIAGNOSIS — I10 Essential (primary) hypertension: Secondary | ICD-10-CM

## 2015-12-29 ENCOUNTER — Ambulatory Visit (INDEPENDENT_AMBULATORY_CARE_PROVIDER_SITE_OTHER): Payer: Managed Care, Other (non HMO) | Admitting: Family Medicine

## 2015-12-29 ENCOUNTER — Encounter: Payer: Self-pay | Admitting: Family Medicine

## 2015-12-29 VITALS — BP 118/72 | HR 84 | Temp 98.9°F | Ht 61.0 in | Wt 90.2 lb

## 2015-12-29 DIAGNOSIS — M75101 Unspecified rotator cuff tear or rupture of right shoulder, not specified as traumatic: Secondary | ICD-10-CM | POA: Diagnosis not present

## 2015-12-29 MED ORDER — TRIAMCINOLONE ACETONIDE 40 MG/ML IJ SUSP: 40.0000 mg | Freq: Once | INTRAMUSCULAR | Status: DC

## 2015-12-29 NOTE — Patient Instructions (Signed)
Great to meet you!  Try the exercises as you start to feel better

## 2015-12-29 NOTE — Progress Notes (Signed)
   HPI  Patient presents today Here with left shoulder pain.  Patient explains that it's been aching and hurting pertently for about 5 weeks. Right shoulder pain with no known injury. She has tried  Aleve, Advil, and Tylenol with mild improvement. She had  Dyspepsia.  She has difficulty shifting gears in her car, she has pain with 3 her purse.  She has no discrete injury.  HSe has limited ROM and pain with reaching over her head or behind her   PMH: Smoking status noted ROS: Per HPI  Objective: BP 118/72   Pulse 84   Temp 98.9 F (37.2 C) (Oral)   Ht 5\' 1"  (1.549 m)   Wt 90 lb 4 oz (40.9 kg)   BMI 17.05 kg/m  Gen: NAD, alert, cooperative with exam HEENT: NCAT, EOMI, PERRL CV: RRR, good S1/S2, no murmur Resp: CTABL, no wheezes, non-labored Abd: SNTND, BS present, no guarding or organomegaly Ext: No edema, warm Neuro: Alert and oriented, No gross deficits  MSK: Limited ROM, Only 110-130 degrees for forward flexion,full with Apley scratch test Pain with Hawkins test and empty can test, however Hawkins was most significant. Tenderness to palpation over  Bicipital groove  R shoulder/subacromial injection  Informed consent obtained  Area cleaned with chlorhexidine X 2 and wiped clear with alcohol swab.  Using 21 1/2 gauge needle 1 cc Kenalog and 3 cc's 1% Lidocaine were injected in subacromial space via posterior approach.  Sterile bandage placed.  Patient tolerated procedure well.  No complications.     Assessment and plan:  # rotator cuff syndrome Some tenderness over the bicipital groove, however with positive signs for impingement I think it's most likely rotator cuff involvement. Subacromial bursal injection today with Kenalog. Given handout from the sports medicine patient advisor for rehabilitation exercises Offered orthopedics or sports medicine referral as well as x-ray which patient defers for now.    Meds ordered this encounter  Medications  .  triamcinolone acetonide (KENALOG-40) injection 40 mg    Laroy Apple, MD [facility] 12/29/2015, 4:22 PM

## 2016-01-04 ENCOUNTER — Other Ambulatory Visit: Payer: Self-pay | Admitting: Family Medicine

## 2016-01-05 ENCOUNTER — Other Ambulatory Visit: Payer: Self-pay | Admitting: Family Medicine

## 2016-03-12 ENCOUNTER — Other Ambulatory Visit: Payer: Self-pay | Admitting: Family Medicine

## 2016-03-12 DIAGNOSIS — I10 Essential (primary) hypertension: Secondary | ICD-10-CM

## 2016-07-23 ENCOUNTER — Other Ambulatory Visit: Payer: Self-pay | Admitting: Family Medicine

## 2016-07-23 DIAGNOSIS — I10 Essential (primary) hypertension: Secondary | ICD-10-CM

## 2016-08-03 ENCOUNTER — Other Ambulatory Visit: Payer: Self-pay | Admitting: Family Medicine

## 2016-09-04 ENCOUNTER — Other Ambulatory Visit: Payer: Self-pay | Admitting: Family Medicine

## 2016-09-04 NOTE — Telephone Encounter (Signed)
Left voicemail for patient to call and schedule follow up.

## 2016-09-17 ENCOUNTER — Ambulatory Visit (INDEPENDENT_AMBULATORY_CARE_PROVIDER_SITE_OTHER): Payer: Managed Care, Other (non HMO)

## 2016-09-17 ENCOUNTER — Ambulatory Visit (INDEPENDENT_AMBULATORY_CARE_PROVIDER_SITE_OTHER): Payer: Managed Care, Other (non HMO) | Admitting: Family Medicine

## 2016-09-17 ENCOUNTER — Encounter: Payer: Self-pay | Admitting: Family Medicine

## 2016-09-17 VITALS — BP 121/88 | HR 88 | Temp 97.8°F | Ht 61.0 in | Wt 87.0 lb

## 2016-09-17 DIAGNOSIS — R55 Syncope and collapse: Secondary | ICD-10-CM

## 2016-09-17 NOTE — Progress Notes (Signed)
Subjective:  Patient ID: Mallory [name], female    DOB: 08/03/1955  Age: 61 y.o. MRN: [id number]  CC: Dizziness (pt here today stating she had an episode on Sunday where she became dizzy, hot and weak. She states she passed out. She has been out of her Metoprolol for over 2 months.)   HPI Mallory [name] presents for Symptoms are as noted above. Patient says that she had a weakness noted a couple of days before the onset noted above with the hotness followed by weakness and dizziness and passing out. She was only out for a moment. After that she came to soaking wet in sweat. She denies that she was postictal in that there was no urination. No postictal fatigue. She did have a queasy stomach afterwards. It occurred 2 days ago while she had been cooking lunch for about an hour.  Depression screen [facility] 2/9 09/17/2016 12/29/2015 12/06/2014  Decreased Interest 0 0 1  Down, Depressed, Hopeless 0 0 0  PHQ - 2 Score 0 0 1    History Mallory [name] has a past medical history of Asthma; Depression; Hyperlipidemia; and Hypertension.   She has a past surgical history that includes Abdominal hysterectomy.   Her family history includes Cancer in her brother and sister.She reports that she has been smoking Cigarettes.  She has a 10.00 pack-year smoking history. She has never used smokeless tobacco. She reports that she does not drink alcohol or use drugs.    ROS Review of Systems  Constitutional: Positive for diaphoresis and fatigue. Negative for activity change, appetite change and fever.  HENT: Negative for congestion, rhinorrhea and sore throat.   Eyes: Negative for visual disturbance.  Respiratory: Negative for cough and shortness of breath.   Cardiovascular: Negative for chest pain and palpitations.  Gastrointestinal: Positive for nausea. Negative for abdominal pain, diarrhea and vomiting.  Genitourinary: Negative for dysuria.  Musculoskeletal: Negative for arthralgias and myalgias.    Neurological: Positive for dizziness, syncope and weakness (nonfocal).    Objective:  BP 121/88   Pulse 88   Temp 97.8 F (36.6 C) (Oral)   Ht 5' 1" (1.549 m)   Wt 87 lb (39.5 kg)   BMI 16.44 kg/m   BP Readings from Last 3 Encounters:  09/17/16 121/88  12/29/15 118/72  06/22/15 (!) 142/81    Wt Readings from Last 3 Encounters:  09/17/16 87 lb (39.5 kg)  12/29/15 90 lb 4 oz (40.9 kg)  06/22/15 87 lb (39.5 kg)     Physical Exam  Constitutional: She is oriented to person, place, and time. She appears well-developed and well-nourished. No distress.  HENT:  Head: Normocephalic and atraumatic.  Right Ear: External ear normal.  Left Ear: External ear normal.  Nose: Nose normal.  Mouth/Throat: Oropharynx is clear and moist.  Eyes: Pupils are equal, round, and reactive to light. Conjunctivae and EOM are normal.  Neck: Normal range of motion. Neck supple. No thyromegaly present.  Cardiovascular: Normal rate, regular rhythm and normal heart sounds.   No murmur heard. Pulmonary/Chest: Effort normal and breath sounds normal. No respiratory distress. She has no wheezes. She has no rales.  Abdominal: Soft. Bowel sounds are normal. She exhibits no distension. There is no tenderness.  Lymphadenopathy:    She has no cervical adenopathy.  Neurological: She is alert and oriented to person, place, and time. She has normal reflexes.  Skin: Skin is warm and dry.  Psychiatric: She has a normal mood and affect. Her behavior is normal.  Judgment and thought content normal.      Assessment & Plan:   Mallory [name] was seen today for dizziness.  Diagnoses and all orders for this visit:  Syncope and collapse -     CBC with Differential/Platelet -     CMP14+EGFR -     DG Chest 2 View; Future -     D-dimer, quantitative (not at [facility]) -     EKG 12-Lead -     TSH -     Cancel: Urinalysis       I have discontinued Mallory [name] metoprolol succinate. I am also having her maintain her  amLODipine. We will continue to administer triamcinolone acetonide.  Allergies as of 09/17/2016      Reactions   Aspirin [aspirin] Palpitations      Medication List       Accurate as of 09/17/16 11:59 PM. Always use your most recent med list.          amLODipine 5 MG tablet Commonly known as:  NORVASC TAKE 1 TABLET (5MG TOTAL) BY MOUTH DAILY. LAST REFILL. PLEASE MAKE APPT.   olmesartan-hydrochlorothiazide 20-12.5 MG tablet Commonly known as:  BENICAR HCT TAKE 1 TABLET BY MOUTH EVERY MORNING.        Follow-up: Return in about 2 weeks (around 10/01/2016).  Mallory [name], M.D.

## 2016-09-18 LAB — CBC WITH DIFFERENTIAL/PLATELET
BASOS: 1 %
Basophils Absolute: 0 10*3/uL (ref 0.0–0.2)
EOS (ABSOLUTE): 0.1 10*3/uL (ref 0.0–0.4)
EOS: 2 %
HEMATOCRIT: 44 % (ref 34.0–46.6)
HEMOGLOBIN: 15 g/dL (ref 11.1–15.9)
IMMATURE GRANS (ABS): 0 10*3/uL (ref 0.0–0.1)
IMMATURE GRANULOCYTES: 0 %
LYMPHS: 50 %
Lymphocytes Absolute: 3.8 10*3/uL — ABNORMAL HIGH (ref 0.7–3.1)
MCH: 32.3 pg (ref 26.6–33.0)
MCHC: 34.1 g/dL (ref 31.5–35.7)
MCV: 95 fL (ref 79–97)
MONOCYTES: 7 %
MONOS ABS: 0.5 10*3/uL (ref 0.1–0.9)
NEUTROS PCT: 40 %
Neutrophils Absolute: 3 10*3/uL (ref 1.4–7.0)
Platelets: 388 10*3/uL — ABNORMAL HIGH (ref 150–379)
RBC: 4.65 x10E6/uL (ref 3.77–5.28)
RDW: 13 % (ref 12.3–15.4)
WBC: 7.5 10*3/uL (ref 3.4–10.8)

## 2016-09-18 LAB — CMP14+EGFR
ALT: 24 IU/L (ref 0–32)
AST: 27 IU/L (ref 0–40)
Albumin/Globulin Ratio: 1.9 (ref 1.2–2.2)
Albumin: 5.1 g/dL — ABNORMAL HIGH (ref 3.6–4.8)
Alkaline Phosphatase: 76 IU/L (ref 39–117)
BUN/Creatinine Ratio: 21 (ref 12–28)
BUN: 15 mg/dL (ref 8–27)
Bilirubin Total: 0.6 mg/dL (ref 0.0–1.2)
CALCIUM: 10.4 mg/dL — AB (ref 8.7–10.3)
CO2: 24 mmol/L (ref 20–29)
CREATININE: 0.72 mg/dL (ref 0.57–1.00)
Chloride: 96 mmol/L (ref 96–106)
GFR, EST AFRICAN AMERICAN: 105 mL/min/{1.73_m2} (ref >59)
GFR, EST NON AFRICAN AMERICAN: 91 mL/min/{1.73_m2} (ref >59)
GLOBULIN, TOTAL: 2.7 g/dL (ref 1.5–4.5)
Glucose: 89 mg/dL (ref 65–99)
Potassium: 4.3 mmol/L (ref 3.5–5.2)
SODIUM: 137 mmol/L (ref 134–144)
TOTAL PROTEIN: 7.8 g/dL (ref 6.0–8.5)

## 2016-09-18 LAB — D-DIMER, QUANTITATIVE: D-DIMER: 0.41 mg/L FEU (ref 0.00–0.49)

## 2016-09-18 LAB — TSH: TSH: 3.04 u[IU]/mL (ref 0.450–4.500)

## 2016-09-26 ENCOUNTER — Other Ambulatory Visit: Payer: Self-pay | Admitting: Family Medicine

## 2016-10-04 ENCOUNTER — Ambulatory Visit: Payer: Managed Care, Other (non HMO) | Admitting: Family Medicine

## 2016-10-04 ENCOUNTER — Other Ambulatory Visit: Payer: Self-pay | Admitting: Family Medicine

## 2016-10-14 ENCOUNTER — Ambulatory Visit (INDEPENDENT_AMBULATORY_CARE_PROVIDER_SITE_OTHER): Payer: Managed Care, Other (non HMO) | Admitting: Family Medicine

## 2016-10-14 VITALS — BP 97/64 | HR 64 | Temp 98.3°F | Ht 61.0 in | Wt 89.0 lb

## 2016-10-14 DIAGNOSIS — J439 Emphysema, unspecified: Secondary | ICD-10-CM | POA: Diagnosis not present

## 2016-10-14 DIAGNOSIS — I10 Essential (primary) hypertension: Secondary | ICD-10-CM | POA: Diagnosis not present

## 2016-10-14 MED ORDER — FLUTICASONE FUROATE-VILANTEROL 200-25 MCG/INH IN AEPB: 1.0000 | INHALATION_SPRAY | Freq: Every day | RESPIRATORY_TRACT | 11 refills | Status: DC

## 2016-10-14 NOTE — Progress Notes (Signed)
Subjective:  Patient ID: Mallory [name], female    DOB: March 29, 1955  Age: 61 y.o. MRN: [id number]  CC: Follow-up (pt here today for a PFT to after xray showed signs of emphysema and pt states she has had some SOB since last visit.)   HPI Mallory [name] presents for Patient relates that her shortness of breath is getting worse. She used a Symbicort inhaler about 4 years ago with some improvement at that time. Currently when she walks across a parking lot to a door such as our parking lot about 50 feet she'll start feeling winded. She is able to continue somewhat but has to rest frequently. She'll say she carries about 3 trips of groceries into her house from her car and feels winded then. She also says that heat seems to make it worse as well as climbing an incline or steps.  Depression screen [facility] 2/9 10/14/2016 09/17/2016 12/29/2015  Decreased Interest 0 0 0  Down, Depressed, Hopeless 0 0 0  PHQ - 2 Score 0 0 0    History Obie has a past medical history of Asthma; Depression; Hyperlipidemia; and Hypertension.   She has a past surgical history that includes Abdominal hysterectomy.   Her family history includes Cancer in her brother and sister.She reports that she has been smoking Cigarettes.  She has a 10.00 pack-year smoking history. She has never used smokeless tobacco. She reports that she does not drink alcohol or use drugs.    ROS Review of Systems  Constitutional: Negative for activity change, appetite change and fever.  HENT: Negative for congestion, rhinorrhea and sore throat.   Eyes: Negative for visual disturbance.  Respiratory: Positive for shortness of breath and wheezing. Negative for cough.   Cardiovascular: Negative for chest pain and palpitations.  Gastrointestinal: Negative for abdominal pain, diarrhea and nausea.  Genitourinary: Negative for dysuria.  Musculoskeletal: Negative for arthralgias and myalgias.    Objective:  BP 97/64   Pulse 64   Temp 98.3  F (36.8 C) (Oral)   Ht 5\' 1"  (1.549 m)   Wt 89 lb (40.4 kg)   BMI 16.82 kg/m   BP Readings from Last 3 Encounters:  10/14/16 97/64  09/17/16 121/88  12/29/15 118/72    Wt Readings from Last 3 Encounters:  10/14/16 89 lb (40.4 kg)  09/17/16 87 lb (39.5 kg)  12/29/15 90 lb 4 oz (40.9 kg)     Physical Exam  Constitutional: She is oriented to person, place, and time. She appears well-developed and well-nourished. No distress.  HENT:  Head: Normocephalic and atraumatic.  Right Ear: External ear normal.  Left Ear: External ear normal.  Nose: Nose normal.  Mouth/Throat: Oropharynx is clear and moist.  Eyes: Pupils are equal, round, and reactive to light. Conjunctivae and EOM are normal.  Neck: Normal range of motion. Neck supple. No thyromegaly present.  Cardiovascular: Normal rate, regular rhythm and normal heart sounds.   No murmur heard. Pulmonary/Chest: Effort normal and breath sounds normal. No respiratory distress. She has no wheezes. She has no rales.  Abdominal: Soft. There is no tenderness.  Lymphadenopathy:    She has no cervical adenopathy.  Neurological: She is alert and oriented to person, place, and time. She has normal reflexes.  Skin: Skin is warm and dry.  Psychiatric: She has a normal mood and affect. Her behavior is normal. Judgment and thought content normal.      Assessment & Plan:   Mallory [name] was seen today for follow-up.  Diagnoses  and all orders for this visit:  Pulmonary emphysema, unspecified emphysema type ([place]) -     PR BREATHING CAPACITY TEST  Essential hypertension  Other orders -     fluticasone furoate-vilanterol (BREO ELLIPTA) 200-25 MCG/INH AEPB; Inhale 1 puff into the lungs daily.       I am having Ms. Lafata start on fluticasone furoate-vilanterol. I am also having her maintain her olmesartan-hydrochlorothiazide and amLODipine. We will continue to administer triamcinolone acetonide.  Allergies as of 10/14/2016      Reactions    Aspirin [aspirin] Palpitations      Medication List       Accurate as of 10/14/16 10:13 AM. Always use your most recent med list.          amLODipine 5 MG tablet Commonly known as:  NORVASC TAKE 1 TABLET (5MG  TOTAL) BY MOUTH DAILY. LAST REFILL. PLEASE MAKE APPT.   fluticasone furoate-vilanterol 200-25 MCG/INH Aepb Commonly known as:  BREO ELLIPTA Inhale 1 puff into the lungs daily.   olmesartan-hydrochlorothiazide 20-12.5 MG tablet Commonly known as:  BENICAR HCT TAKE 1 TABLET BY MOUTH EVERY MORNING.        Follow-up: Return in about 6 weeks (around 11/25/2016).  Mallory [name], M.D.

## 2016-11-11 ENCOUNTER — Ambulatory Visit (INDEPENDENT_AMBULATORY_CARE_PROVIDER_SITE_OTHER): Payer: Managed Care, Other (non HMO) | Admitting: Pediatrics

## 2016-11-11 ENCOUNTER — Encounter: Payer: Self-pay | Admitting: Pediatrics

## 2016-11-11 VITALS — BP 121/82 | HR 90 | Temp 98.3°F | Ht 61.0 in | Wt 88.8 lb

## 2016-11-11 DIAGNOSIS — L03211 Cellulitis of face: Secondary | ICD-10-CM

## 2016-11-11 MED ORDER — SULFAMETHOXAZOLE-TRIMETHOPRIM 800-160 MG PO TABS: 1.0000 | ORAL_TABLET | Freq: Two times a day (BID) | ORAL | 0 refills | Status: DC

## 2016-11-11 NOTE — Progress Notes (Signed)
  Subjective:   Patient ID: Mallory [name], female    DOB: 1955/12/08, 61 y.o.   MRN: [id number] CC: Insect Bite (Left Cheek)  HPI: Mallory [name] is a 61 y.o. female presenting for Insect Bite (Left Cheek)  Thinks she was bit by a spider 5 days ago Has been itching a lot Now with pain as well L cheek with red slightly raised plaque, some surrounding redness hasnt put anything on the bite at home No fevers Appetite has been fine Feeling well otherwise  Relevant past medical, surgical, family and social history reviewed. Allergies and medications reviewed and updated. History  Smoking Status  . Current Every Day Smoker  . Packs/day: 0.25  . Years: 40.00  . Types: Cigarettes  Smokeless Tobacco  . Never Used   ROS: Per HPI   Objective:    BP 121/82   Pulse 90   Temp 98.3 F (36.8 C) (Oral)   Ht 5\' 1"  (1.549 m)   Wt 88 lb 12.8 oz (40.3 kg)   BMI 16.78 kg/m   Wt Readings from Last 3 Encounters:  11/11/16 88 lb 12.8 oz (40.3 kg)  10/14/16 89 lb (40.4 kg)  09/17/16 87 lb (39.5 kg)    Gen: NAD, alert, cooperative with exam, NCAT EYES: EOMI, no conjunctival injection, or no icterus LYMPH: no cervical LAD CV: NRRR, normal S1/S2, no murmur, distal pulses 2+ b/l Resp: CTABL, no wheezes, normal WOB Ext: No edema, warm Neuro: Alert and oriented Skin: apprx 1 cm red raised rash L cheek, red rash with some papules extending down toward jaw Slightly ttp  Assessment & Plan:  Mallory [name] was seen today for insect bite.  Diagnoses and all orders for this visit:  Cellulitis of face Pain worsening, slightly ttp Treat for infection with below Return precautions discussed -     sulfamethoxazole-trimethoprim (BACTRIM DS) 800-160 MG tablet; Take 1 tablet by mouth 2 (two) times daily.   Follow up plan: Return if symptoms worsen or fail to improve. Assunta Found, MD [facility]

## 2016-11-27 ENCOUNTER — Other Ambulatory Visit: Payer: Self-pay | Admitting: Family Medicine

## 2017-03-01 ENCOUNTER — Other Ambulatory Visit: Payer: Self-pay | Admitting: Family Medicine

## 2017-04-03 ENCOUNTER — Other Ambulatory Visit: Payer: Self-pay | Admitting: Family Medicine

## 2017-04-03 NOTE — Telephone Encounter (Signed)
Last seen 11/11/16

## 2017-04-03 NOTE — Telephone Encounter (Signed)
Authorize 30 days only. Then contact the patient letting them know that they will need an appointment before any further prescriptions can be sent in. 

## 2017-05-08 ENCOUNTER — Other Ambulatory Visit: Payer: Self-pay | Admitting: Family Medicine

## 2017-05-08 NOTE — Telephone Encounter (Signed)
Last seen 11/11/16  Dr Livia Snellen

## 2017-05-08 NOTE — Telephone Encounter (Signed)
Authorize 30 days only. Then contact the patient letting them know that they will need an appointment before any further prescriptions can be sent in. 

## 2017-06-17 ENCOUNTER — Other Ambulatory Visit: Payer: Self-pay | Admitting: Family Medicine

## 2017-06-18 NOTE — Telephone Encounter (Signed)
Authorize 30 days only. Then contact the patient letting them know that they will need an appointment before any further prescriptions can be sent in. 

## 2017-07-13 ENCOUNTER — Other Ambulatory Visit: Payer: Self-pay | Admitting: Family Medicine

## 2017-07-22 ENCOUNTER — Other Ambulatory Visit: Payer: Self-pay | Admitting: Family Medicine

## 2017-10-20 ENCOUNTER — Other Ambulatory Visit: Payer: Self-pay | Admitting: Family Medicine

## 2017-10-20 DIAGNOSIS — Z1231 Encounter for screening mammogram for malignant neoplasm of breast: Secondary | ICD-10-CM

## 2017-10-20 DIAGNOSIS — Z1239 Encounter for other screening for malignant neoplasm of breast: Secondary | ICD-10-CM

## 2017-10-22 ENCOUNTER — Other Ambulatory Visit: Payer: Self-pay | Admitting: *Deleted

## 2017-10-22 DIAGNOSIS — Z1239 Encounter for other screening for malignant neoplasm of breast: Secondary | ICD-10-CM

## 2017-12-17 ENCOUNTER — Ambulatory Visit (INDEPENDENT_AMBULATORY_CARE_PROVIDER_SITE_OTHER): Payer: 59

## 2017-12-17 ENCOUNTER — Other Ambulatory Visit: Payer: Self-pay

## 2017-12-17 ENCOUNTER — Encounter: Payer: Self-pay | Admitting: Family Medicine

## 2017-12-17 ENCOUNTER — Ambulatory Visit: Payer: Managed Care, Other (non HMO) | Admitting: Family Medicine

## 2017-12-17 VITALS — BP 155/93 | HR 91 | Temp 97.9°F | Ht 61.0 in | Wt 90.0 lb

## 2017-12-17 DIAGNOSIS — Z23 Encounter for immunization: Secondary | ICD-10-CM | POA: Diagnosis not present

## 2017-12-17 DIAGNOSIS — S5331XS Traumatic rupture of right ulnar collateral ligament, sequela: Secondary | ICD-10-CM

## 2017-12-17 DIAGNOSIS — I10 Essential (primary) hypertension: Secondary | ICD-10-CM

## 2017-12-17 DIAGNOSIS — K029 Dental caries, unspecified: Secondary | ICD-10-CM

## 2017-12-17 DIAGNOSIS — E785 Hyperlipidemia, unspecified: Secondary | ICD-10-CM

## 2017-12-17 LAB — LIPID PANEL
Chol/HDL Ratio: 2.5 ratio (ref 0.0–4.4)
Cholesterol, Total: 223 mg/dL — ABNORMAL HIGH (ref 100–199)
HDL: 89 mg/dL (ref >39)
LDL Calculated: 116 mg/dL — ABNORMAL HIGH (ref 0–99)
Triglycerides: 91 mg/dL (ref 0–149)
VLDL Cholesterol Cal: 18 mg/dL (ref 5–40)

## 2017-12-17 LAB — CMP14+EGFR
ALBUMIN: 4.5 g/dL (ref 3.6–4.8)
ALK PHOS: 80 IU/L (ref 39–117)
ALT: 18 IU/L (ref 0–32)
AST: 23 IU/L (ref 0–40)
Albumin/Globulin Ratio: 1.8 (ref 1.2–2.2)
BUN / CREAT RATIO: 21 (ref 12–28)
BUN: 15 mg/dL (ref 8–27)
Bilirubin Total: 0.4 mg/dL (ref 0.0–1.2)
CALCIUM: 10.2 mg/dL (ref 8.7–10.3)
CHLORIDE: 101 mmol/L (ref 96–106)
CO2: 25 mmol/L (ref 20–29)
CREATININE: 0.72 mg/dL (ref 0.57–1.00)
GFR calc Af Amer: 105 mL/min/{1.73_m2} (ref >59)
GFR, EST NON AFRICAN AMERICAN: 91 mL/min/{1.73_m2} (ref >59)
GLOBULIN, TOTAL: 2.5 g/dL (ref 1.5–4.5)
GLUCOSE: 70 mg/dL (ref 65–99)
POTASSIUM: 4.4 mmol/L (ref 3.5–5.2)
SODIUM: 141 mmol/L (ref 134–144)
Total Protein: 7 g/dL (ref 6.0–8.5)

## 2017-12-17 LAB — CBC WITH DIFFERENTIAL/PLATELET
Basophils Absolute: 0 10*3/uL (ref 0.0–0.2)
Basos: 1 %
EOS (ABSOLUTE): 0.1 10*3/uL (ref 0.0–0.4)
Eos: 2 %
Hematocrit: 40 % (ref 34.0–46.6)
Hemoglobin: 13.5 g/dL (ref 11.1–15.9)
Immature Grans (Abs): 0 10*3/uL (ref 0.0–0.1)
Immature Granulocytes: 0 %
Lymphocytes Absolute: 2.5 10*3/uL (ref 0.7–3.1)
Lymphs: 49 %
MCH: 31.4 pg (ref 26.6–33.0)
MCHC: 33.8 g/dL (ref 31.5–35.7)
MCV: 93 fL (ref 79–97)
Monocytes Absolute: 0.5 10*3/uL (ref 0.1–0.9)
Monocytes: 10 %
Neutrophils Absolute: 1.9 10*3/uL (ref 1.4–7.0)
Neutrophils: 38 %
Platelets: 352 10*3/uL (ref 150–450)
RBC: 4.3 x10E6/uL (ref 3.77–5.28)
RDW: 12.7 % (ref 12.3–15.4)
WBC: 4.9 10*3/uL (ref 3.4–10.8)

## 2017-12-17 MED ORDER — AMLODIPINE BESYLATE 5 MG PO TABS: ORAL_TABLET | ORAL | 5 refills | Status: DC

## 2017-12-17 MED ORDER — PREDNISONE 10 MG (21) PO TBPK: ORAL_TABLET | Freq: Every day | ORAL | 0 refills | Status: DC

## 2017-12-17 MED ORDER — FLUTICASONE FUROATE-VILANTEROL 100-25 MCG/INH IN AEPB: 1.0000 | INHALATION_SPRAY | Freq: Every day | RESPIRATORY_TRACT | 5 refills | Status: DC

## 2017-12-17 MED ORDER — OLMESARTAN MEDOXOMIL-HCTZ 20-12.5 MG PO TABS: ORAL_TABLET | ORAL | 5 refills | Status: DC

## 2017-12-17 NOTE — Progress Notes (Signed)
Subjective:  Patient ID: Mallory [name], female    DOB: 02/05/1956  Age: 62 y.o. MRN: [id number]  CC: Medical Management of Chronic Issues (She has been out of BP meds for a while)   HPI Mallory [name] presents for  follow-up of hypertension. Patient has no history of headache chest pain or shortness of breath or recent cough. Patient also denies symptoms of TIA such as focal numbness or weakness. Patient denies side effects from medication.  She has been out of her blood pressure medicine for several weeks.  Patient tells me that she has a great deal of dental caries and she went to the emergency room recently with an abscess.  That was drained but her blood pressure was noted to be 334 systolic.  She has been told she needs multiple extractions and needs a referral to oral surgery.  Additionally she is having some soreness in the right thumb at the base and there is some swelling at the MCP.  Patient tells me she only takes the [facility] occasionally.  Primarily she stays inside but if she gets outside in the heat she may get some dyspnea on exertion.  She states also that when the weather cools off and people turned on the heat, the smell from the heating ducts having been sitting for several months can exacerbate her breathing 2.  She will use the [facility] for a few days when that occurs.  History Mallory [name] has a past medical history of Asthma, Depression, Hyperlipidemia, and Hypertension.   She has a past surgical history that includes Abdominal hysterectomy.   Her family history includes Cancer in her brother and sister.She reports that she has been smoking cigarettes. She has a 10.00 pack-year smoking history. She has never used smokeless tobacco. She reports that she does not drink alcohol or use drugs.  No current outpatient medications on file prior to visit.   Current Facility-Administered Medications on File Prior to Visit  Medication Dose Route Frequency Provider Last Rate Last Dose  .  triamcinolone acetonide (KENALOG-40) injection 40 mg  40 mg Intra-articular Once Timmothy Euler, MD        ROS Review of Systems  Constitutional: Negative.   HENT: Negative for congestion.   Eyes: Negative for visual disturbance.  Respiratory: Negative for shortness of breath.   Cardiovascular: Negative for chest pain.  Gastrointestinal: Negative for abdominal pain, constipation, diarrhea, nausea and vomiting.  Genitourinary: Negative for difficulty urinating.  Musculoskeletal: Positive for arthralgias (Right thumb). Negative for myalgias.  Neurological: Negative for headaches.  Psychiatric/Behavioral: Negative for sleep disturbance.    Objective:  BP (!) 155/93   Pulse 91   Temp 97.9 F (36.6 C) (Oral)   Ht _0  (1.549 m)   Wt 90 lb (40.8 kg)   BMI 17.01 kg/m   BP Readings from Last 3 Encounters:  12/17/17 (!) 155/93  11/11/16 121/82  10/14/16 97/64    Wt Readings from Last 3 Encounters:  12/17/17 90 lb (40.8 kg)  11/11/16 88 lb 12.8 oz (40.3 kg)  10/14/16 89 lb (40.4 kg)     Physical Exam  Constitutional: She is oriented to person, place, and time. She appears well-developed and well-nourished. No distress.  HENT:  Head: Normocephalic and atraumatic.  Right Ear: External ear normal.  Left Ear: External ear normal.  Nose: Nose normal.  Mouth/Throat: Oropharynx is clear and moist. Dental caries (There is severe caries visible in virtually all of the teeth) present.  Eyes: Pupils are  equal, round, and reactive to light. Conjunctivae and EOM are normal.  Neck: Normal range of motion. Neck supple. No thyromegaly present.  Cardiovascular: Normal rate, regular rhythm and normal heart sounds.  No murmur heard. Pulmonary/Chest: Effort normal and breath sounds normal. No respiratory distress. She has no wheezes. She has no rales.  Abdominal: Soft. Bowel sounds are normal. She exhibits no distension. There is no tenderness.  Musculoskeletal: Normal range of motion.  She exhibits deformity (There is hypertrophic joint with some measure of edema and tenderness noted at the right first MCP).  Lymphadenopathy:    She has no cervical adenopathy.  Neurological: She is alert and oriented to person, place, and time. She has normal reflexes.  Skin: Skin is warm and dry.  Psychiatric: She has a normal mood and affect. Her behavior is normal. Judgment and thought content normal.    Thumb x-ray shows soft tissue swelling and some arthritic changes as well.  Assessment & Plan:   Mallory [name] was seen today for medical management of chronic issues.  Diagnoses and all orders for this visit:  Essential hypertension -     CBC with Differential/Platelet -     CMP14+EGFR  Stener lesion, thumb MP (metacarpophalangeal) joint, right, sequela -     DG Hand Complete Right; Future -     DME Other see comment  Severe dental caries -     Ambulatory referral to Oral Maxillofacial Surgery  Hyperlipidemia, unspecified hyperlipidemia type -     Lipid panel  Need for immunization against influenza -     Flu Vaccine QUAD 36+ mos IM  Other orders -     predniSONE (STERAPRED UNI-PAK 21 TAB) 10 MG (21) TBPK tablet; Take by mouth daily. As directed x 6 days   Allergies as of 12/17/2017      Reactions   Aspirin [aspirin] Palpitations      Medication List        Accurate as of 12/17/17  7:15 PM. Always use your most recent med list.          amLODipine 5 MG tablet Commonly known as:  NORVASC TAKE 1 TABLET BY MOUTH EVERY DAY (NEED TO MAKE AN APPOINTMENT)   fluticasone furoate-vilanterol 100-25 MCG/INH Aepb Commonly known as:  BREO ELLIPTA Inhale 1 puff into the lungs daily.   olmesartan-hydrochlorothiazide 20-12.5 MG tablet Commonly known as:  BENICAR HCT TAKE 1 TABLET BY MOUTH EVERY DAY IN THE MORNING   predniSONE 10 MG (21) Tbpk tablet Commonly known as:  STERAPRED UNI-PAK 21 TAB Take by mouth daily. As directed x 6 days            Durable Medical  Equipment  (From admission, onward)         Start     Ordered   12/17/17 0000  DME Other see comment    Comments:  Thumb spica for right hand due to right hand pain   12/17/17 0933          Meds ordered this encounter  Medications  . predniSONE (STERAPRED UNI-PAK 21 TAB) 10 MG (21) TBPK tablet    Sig: Take by mouth daily. As directed x 6 days    Dispense:  21 tablet    Refill:  0      Follow-up: Return in about 6 months (around 06/18/2018).  Claretta Fraise, M.D.

## 2018-01-01 ENCOUNTER — Encounter: Payer: Self-pay | Admitting: *Deleted

## 2018-05-15 ENCOUNTER — Other Ambulatory Visit: Payer: Self-pay | Admitting: Family Medicine

## 2018-07-05 ENCOUNTER — Other Ambulatory Visit: Payer: Self-pay | Admitting: Family Medicine

## 2018-07-22 ENCOUNTER — Telehealth: Payer: Self-pay | Admitting: Family Medicine

## 2018-07-22 NOTE — Telephone Encounter (Signed)
LM pt needs to schedule 6 month HTN recheck with Dr Livia Snellen

## 2018-07-27 ENCOUNTER — Other Ambulatory Visit: Payer: Self-pay

## 2018-07-28 ENCOUNTER — Ambulatory Visit (INDEPENDENT_AMBULATORY_CARE_PROVIDER_SITE_OTHER): Payer: 59

## 2018-07-28 ENCOUNTER — Other Ambulatory Visit: Payer: Self-pay

## 2018-07-28 ENCOUNTER — Encounter: Payer: Self-pay | Admitting: Family Medicine

## 2018-07-28 ENCOUNTER — Ambulatory Visit: Payer: 59 | Admitting: Family Medicine

## 2018-07-28 VITALS — BP 125/84 | HR 93 | Temp 98.1°F | Ht 61.0 in | Wt 90.6 lb

## 2018-07-28 DIAGNOSIS — M25449 Effusion, unspecified hand: Secondary | ICD-10-CM

## 2018-07-28 DIAGNOSIS — M2550 Pain in unspecified joint: Secondary | ICD-10-CM

## 2018-07-28 DIAGNOSIS — M79642 Pain in left hand: Secondary | ICD-10-CM

## 2018-07-28 DIAGNOSIS — E785 Hyperlipidemia, unspecified: Secondary | ICD-10-CM | POA: Diagnosis not present

## 2018-07-28 DIAGNOSIS — I10 Essential (primary) hypertension: Secondary | ICD-10-CM | POA: Diagnosis not present

## 2018-07-28 DIAGNOSIS — M79641 Pain in right hand: Secondary | ICD-10-CM | POA: Diagnosis not present

## 2018-07-28 MED ORDER — DICLOFENAC SODIUM 75 MG PO TBEC: 75.0000 mg | DELAYED_RELEASE_TABLET | Freq: Two times a day (BID) | ORAL | 2 refills | Status: DC

## 2018-07-28 NOTE — Progress Notes (Signed)
Subjective:  Patient ID: Mallory [name], female    DOB: Jun 05, 1955  Age: 63 y.o. MRN: [id number]  CC: fingers swelling and Breast Pain   HPI [name] presents for swelling in the fingers and hands bilaterally.  This is been ongoing for about a month but has increased.  Now her rings are stuck and she cannot get them removed she would like to have them cut off today.  Additionally she is having some pain at the base of the fingers in the proximal phalanx of all of her fingers.  This is on the palmar side.  She does not consume excessive salt.  She does take amlodipine for blood pressure but notes that she does not have swelling in the feet and ankles.  Additionally she is taking the olmesartan HCTZ for her blood pressure.  There has been no rash.  Patient tells me that the breast pain actually has resolved.  She leaned over and caught her cross-shaped necklace between the arm of a chair and her chest wall.  As she leaned over this caused a contusion that was painful in that area for a couple of weeks but has now resolved.  She is not experiencing any shortness of breath or cough or localized pain.  She just wants some assurance that things are going to be okay.  Depression screen [facility] 2/9 07/28/2018 12/17/2017 11/11/2016  Decreased Interest 0 0 0  Down, Depressed, Hopeless 0 0 0  PHQ - 2 Score 0 0 0  Altered sleeping 0 - -  Tired, decreased energy 0 - -  Change in appetite 0 - -  Feeling bad or failure about yourself  0 - -  Trouble concentrating 0 - -  Moving slowly or fidgety/restless 0 - -  Suicidal thoughts 0 - -  PHQ-9 Score 0 - -    History Mallory [name] has a past medical history of Asthma, Depression, Hyperlipidemia, and Hypertension.   She has a past surgical history that includes Abdominal hysterectomy.   Her family history includes Cancer in her brother and sister.She reports that she has been smoking cigarettes. She has a 10.00 pack-year smoking history. She has never  used smokeless tobacco. She reports that she does not drink alcohol or use drugs.    ROS Review of Systems  Constitutional: Negative.   HENT: Negative for congestion.   Eyes: Negative for visual disturbance.  Respiratory: Negative for shortness of breath.   Cardiovascular: Negative for chest pain and leg swelling.  Gastrointestinal: Negative for abdominal pain, constipation, diarrhea, nausea and vomiting.  Genitourinary: Negative for difficulty urinating.  Musculoskeletal: Positive for arthralgias (Bilaterally in the fingers). Negative for myalgias.  Neurological: Negative for headaches.  Psychiatric/Behavioral: Negative for sleep disturbance.    Objective:  BP 125/84   Pulse 93   Temp 98.1 F (36.7 C) (Oral)   Ht _0  (1.549 m)   Wt 90 lb 9.6 oz (41.1 kg)   BMI 17.12 kg/m   BP Readings from Last 3 Encounters:  07/28/18 125/84  12/17/17 (!) 155/93  11/11/16 121/82    Wt Readings from Last 3 Encounters:  07/28/18 90 lb 9.6 oz (41.1 kg)  12/17/17 90 lb (40.8 kg)  11/11/16 88 lb 12.8 oz (40.3 kg)     Physical Exam Constitutional:      General: She is in acute distress.     Appearance: Normal appearance. She is well-developed and normal weight.  HENT:     Head: Normocephalic and atraumatic.  Eyes:     Conjunctiva/sclera: Conjunctivae normal.     Pupils: Pupils are equal, round, and reactive to light.  Neck:     Musculoskeletal: Normal range of motion and neck supple.     Thyroid: No thyromegaly.  Cardiovascular:     Rate and Rhythm: Normal rate and regular rhythm.     Heart sounds: Normal heart sounds. No murmur.  Pulmonary:     Effort: Pulmonary effort is normal. No respiratory distress.     Breath sounds: Normal breath sounds. No wheezing or rales.  Musculoskeletal: Normal range of motion.        General: No swelling (Through the soft tissue of the proximal phalanges bilaterally for all of the fingers).     Right lower leg: No edema.     Left lower leg: No  edema.  Lymphadenopathy:     Cervical: No cervical adenopathy.  Skin:    General: Skin is warm and dry.  Neurological:     Mental Status: She is alert and oriented to person, place, and time.  Psychiatric:        Mood and Affect: Mood is anxious.        Speech: Speech normal.        Thought Content: Thought content normal.        Cognition and Memory: Cognition normal.       Assessment & Plan:   Marisal was seen today for fingers swelling and breast pain.  Diagnoses and all orders for this visit:  Essential hypertension -     CBC with Differential/Platelet -     CMP14+EGFR  Hyperlipidemia, unspecified hyperlipidemia type  Arthralgia, unspecified joint -     Rheumatoid factor -     Arthritis Panel -     Cancel: DG Hand Complete Right; Future -     Cancel: DG Hand Complete Left; Future -     DG HANDS 1 VIEW BILAT BALLCATCHERS; Future  Swelling of finger joint, unspecified laterality  Other orders -     diclofenac (VOLTAREN) 75 MG EC tablet; Take 1 tablet (75 mg total) by mouth 2 (two) times daily. For muscle and  Joint pain       I have discontinued Zlata C. Ploch's predniSONE. I am also having her start on diclofenac. Additionally, I am having her maintain her fluticasone furoate-vilanterol, olmesartan-hydrochlorothiazide, and amLODipine. We will continue to administer triamcinolone acetonide.  Allergies as of 07/28/2018      Reactions   Aspirin [aspirin] Palpitations      Medication List       Accurate as of Jul 28, 2018  5:36 PM. If you have any questions, ask your nurse or doctor.        STOP taking these medications   predniSONE 10 MG (21) Tbpk tablet Commonly known as:  STERAPRED UNI-PAK 21 TAB Stopped by:  Claretta Fraise, MD     TAKE these medications   amLODipine 5 MG tablet Commonly known as:  NORVASC TAKE 1 TABLET BY MOUTH EVERY DAY (NEED TO MAKE AN APPOINTMENT)   diclofenac 75 MG EC tablet Commonly known as:  VOLTAREN Take 1 tablet  (75 mg total) by mouth 2 (two) times daily. For muscle and  Joint pain Started by:  Claretta Fraise, MD   fluticasone furoate-vilanterol 100-25 MCG/INH Aepb Commonly known as:  Breo Ellipta Inhale 1 puff into the lungs daily.   olmesartan-hydrochlorothiazide 20-12.5 MG tablet Commonly known as:  BENICAR HCT TAKE 1 TABLET BY MOUTH EVERY DAY  IN THE MORNING      Procedure: Using a standard ring cutter this was applied beneath the band of the rings.  For comfort the rings were turned 180 degrees so the band was on the dorsal aspect.  Subsequently the rings were cut without difficulty and removed with the spreader.  Patient experienced immediate pain relief.  Follow-up: Return in about 2 weeks (around 08/11/2018).  Claretta Fraise, M.D.

## 2018-07-29 ENCOUNTER — Other Ambulatory Visit: Payer: Self-pay | Admitting: Family Medicine

## 2018-07-29 DIAGNOSIS — M05742 Rheumatoid arthritis with rheumatoid factor of left hand without organ or systems involvement: Secondary | ICD-10-CM

## 2018-07-29 DIAGNOSIS — M05741 Rheumatoid arthritis with rheumatoid factor of right hand without organ or systems involvement: Secondary | ICD-10-CM

## 2018-07-29 LAB — CMP14+EGFR
ALT: 16 IU/L (ref 0–32)
AST: 27 IU/L (ref 0–40)
Albumin/Globulin Ratio: 1.6 (ref 1.2–2.2)
Albumin: 5.2 g/dL — ABNORMAL HIGH (ref 3.8–4.8)
Alkaline Phosphatase: 95 IU/L (ref 39–117)
BUN/Creatinine Ratio: 15 (ref 12–28)
BUN: 12 mg/dL (ref 8–27)
Bilirubin Total: 0.3 mg/dL (ref 0.0–1.2)
CO2: 19 mmol/L — ABNORMAL LOW (ref 20–29)
Calcium: 10.6 mg/dL — ABNORMAL HIGH (ref 8.7–10.3)
Chloride: 100 mmol/L (ref 96–106)
Creatinine, Ser: 0.81 mg/dL (ref 0.57–1.00)
GFR calc Af Amer: 91 mL/min/{1.73_m2} (ref >59)
GFR calc non Af Amer: 79 mL/min/{1.73_m2} (ref >59)
Globulin, Total: 3.3 g/dL (ref 1.5–4.5)
Glucose: 95 mg/dL (ref 65–99)
Potassium: 5.1 mmol/L (ref 3.5–5.2)
Sodium: 140 mmol/L (ref 134–144)
Total Protein: 8.5 g/dL (ref 6.0–8.5)

## 2018-07-29 LAB — ARTHRITIS PANEL
Basophils Absolute: 0.1 10*3/uL (ref 0.0–0.2)
Basos: 1 %
EOS (ABSOLUTE): 0.1 10*3/uL (ref 0.0–0.4)
Eos: 2 %
Hematocrit: 44.4 % (ref 34.0–46.6)
Hemoglobin: 15.6 g/dL (ref 11.1–15.9)
Immature Grans (Abs): 0 10*3/uL (ref 0.0–0.1)
Immature Granulocytes: 0 %
Lymphocytes Absolute: 3.8 10*3/uL — ABNORMAL HIGH (ref 0.7–3.1)
Lymphs: 46 %
MCH: 32.9 pg (ref 26.6–33.0)
MCHC: 35.1 g/dL (ref 31.5–35.7)
MCV: 94 fL (ref 79–97)
Monocytes Absolute: 0.5 10*3/uL (ref 0.1–0.9)
Monocytes: 6 %
Neutrophils Absolute: 3.7 10*3/uL (ref 1.4–7.0)
Neutrophils: 45 %
Platelets: 416 10*3/uL (ref 150–450)
RBC: 4.74 x10E6/uL (ref 3.77–5.28)
RDW: 12.3 % (ref 11.7–15.4)
Rhuematoid fact SerPl-aCnc: >650 IU/mL — ABNORMAL HIGH (ref 0.0–13.9)
Sed Rate: 61 mm/hr — ABNORMAL HIGH (ref 0–40)
Uric Acid: 5.4 mg/dL (ref 2.5–7.1)
WBC: 8.2 10*3/uL (ref 3.4–10.8)

## 2018-07-29 MED ORDER — AMLODIPINE BESYLATE 5 MG PO TABS: 5.0000 mg | ORAL_TABLET | Freq: Every day | ORAL | 3 refills | Status: DC

## 2018-07-29 MED ORDER — OLMESARTAN MEDOXOMIL-HCTZ 20-12.5 MG PO TABS: ORAL_TABLET | ORAL | 1 refills | Status: DC

## 2018-12-03 ENCOUNTER — Ambulatory Visit (INDEPENDENT_AMBULATORY_CARE_PROVIDER_SITE_OTHER): Payer: 59 | Admitting: Family Medicine

## 2018-12-03 ENCOUNTER — Other Ambulatory Visit: Payer: Self-pay

## 2018-12-03 DIAGNOSIS — K047 Periapical abscess without sinus: Secondary | ICD-10-CM

## 2018-12-04 ENCOUNTER — Other Ambulatory Visit: Payer: Self-pay

## 2018-12-04 ENCOUNTER — Emergency Department (HOSPITAL_COMMUNITY)
Admission: EM | Admit: 2018-12-04 | Discharge: 2018-12-04 | Disposition: A | Discharge status: Home / Self Care | Payer: No Typology Code available for payment source | Attending: Emergency Medicine | Admitting: Emergency Medicine

## 2018-12-04 ENCOUNTER — Emergency Department (HOSPITAL_COMMUNITY): Payer: No Typology Code available for payment source

## 2018-12-04 DIAGNOSIS — K0889 Other specified disorders of teeth and supporting structures: Secondary | ICD-10-CM | POA: Diagnosis present

## 2018-12-04 DIAGNOSIS — J439 Emphysema, unspecified: Secondary | ICD-10-CM | POA: Diagnosis not present

## 2018-12-04 DIAGNOSIS — Z79899 Other long term (current) drug therapy: Secondary | ICD-10-CM | POA: Insufficient documentation

## 2018-12-04 DIAGNOSIS — K047 Periapical abscess without sinus: Secondary | ICD-10-CM

## 2018-12-04 DIAGNOSIS — I10 Essential (primary) hypertension: Secondary | ICD-10-CM | POA: Insufficient documentation

## 2018-12-04 DIAGNOSIS — F17210 Nicotine dependence, cigarettes, uncomplicated: Secondary | ICD-10-CM | POA: Diagnosis not present

## 2018-12-04 DIAGNOSIS — R252 Cramp and spasm: Secondary | ICD-10-CM | POA: Diagnosis not present

## 2018-12-04 DIAGNOSIS — J3489 Other specified disorders of nose and nasal sinuses: Secondary | ICD-10-CM

## 2018-12-04 LAB — COMPREHENSIVE METABOLIC PANEL
ALT: 22 U/L (ref 0–44)
AST: 23 U/L (ref 15–41)
Albumin: 3.4 g/dL — ABNORMAL LOW (ref 3.5–5.0)
Alkaline Phosphatase: 69 U/L (ref 38–126)
Anion gap: 11 (ref 5–15)
BUN: 9 mg/dL (ref 8–23)
CO2: 28 mmol/L (ref 22–32)
Calcium: 9.4 mg/dL (ref 8.9–10.3)
Chloride: 97 mmol/L — ABNORMAL LOW (ref 98–111)
Creatinine, Ser: 0.61 mg/dL (ref 0.44–1.00)
GFR calc Af Amer: >60 mL/min (ref >60)
GFR calc non Af Amer: >60 mL/min (ref >60)
Glucose, Bld: 105 mg/dL — ABNORMAL HIGH (ref 70–99)
Potassium: 3.7 mmol/L (ref 3.5–5.1)
Sodium: 136 mmol/L (ref 135–145)
Total Bilirubin: 0.7 mg/dL (ref 0.3–1.2)
Total Protein: 7.3 g/dL (ref 6.5–8.1)

## 2018-12-04 LAB — CBC WITH DIFFERENTIAL/PLATELET
Abs Immature Granulocytes: 0.04 10*3/uL (ref 0.00–0.07)
Basophils Absolute: 0 10*3/uL (ref 0.0–0.1)
Basophils Relative: 0 %
Eosinophils Absolute: 0.1 10*3/uL (ref 0.0–0.5)
Eosinophils Relative: 1 %
HCT: 39.4 % (ref 36.0–46.0)
Hemoglobin: 13.8 g/dL (ref 12.0–15.0)
Immature Granulocytes: 1 %
Lymphocytes Relative: 22 %
Lymphs Abs: 1.8 10*3/uL (ref 0.7–4.0)
MCH: 33.2 pg (ref 26.0–34.0)
MCHC: 35 g/dL (ref 30.0–36.0)
MCV: 94.7 fL (ref 80.0–100.0)
Monocytes Absolute: 0.9 10*3/uL (ref 0.1–1.0)
Monocytes Relative: 11 %
Neutro Abs: 5.4 10*3/uL (ref 1.7–7.7)
Neutrophils Relative %: 65 %
Platelets: 305 10*3/uL (ref 150–400)
RBC: 4.16 MIL/uL (ref 3.87–5.11)
RDW: 13 % (ref 11.5–15.5)
WBC: 8.3 10*3/uL (ref 4.0–10.5)
nRBC: 0 % (ref 0.0–0.2)

## 2018-12-04 LAB — LACTIC ACID, PLASMA: Lactic Acid, Venous: 1.1 mmol/L (ref 0.5–1.9)

## 2018-12-04 MED ORDER — HYDROCODONE-ACETAMINOPHEN 7.5-325 MG/15ML PO SOLN: 5.0000 mL | Freq: Four times a day (QID) | ORAL | 0 refills | Status: DC | PRN

## 2018-12-04 MED ORDER — DEXAMETHASONE SODIUM PHOSPHATE 10 MG/ML IJ SOLN
10.0000 mg | Freq: Once | INTRAMUSCULAR | Status: AC
  Administered 2018-12-04: 10 mg via INTRAVENOUS
  Filled 2018-12-04: qty 1

## 2018-12-04 MED ORDER — MELOXICAM 15 MG PO TABS: 15.0000 mg | ORAL_TABLET | Freq: Every day | ORAL | 0 refills | Status: DC

## 2018-12-04 MED ORDER — KETOROLAC TROMETHAMINE 30 MG/ML IJ SOLN
15.0000 mg | Freq: Once | INTRAMUSCULAR | Status: AC
  Administered 2018-12-04: 17:00:00 15 mg via INTRAVENOUS
  Filled 2018-12-04: qty 1

## 2018-12-04 MED ORDER — METHYLPREDNISOLONE 4 MG PO TBPK: ORAL_TABLET | ORAL | 0 refills | Status: DC

## 2018-12-04 MED ORDER — IOHEXOL 300 MG/ML  SOLN
75.0000 mL | Freq: Once | INTRAMUSCULAR | Status: AC | PRN
  Administered 2018-12-04: 75 mL via INTRAVENOUS

## 2018-12-04 NOTE — ED Notes (Signed)
Patient verbalizes understanding of discharge instructions. Opportunity for questioning and answers were provided. Armband removed by staff, pt discharged from ED.  

## 2018-12-04 NOTE — ED Provider Notes (Signed)
[facility] EMERGENCY DEPARTMENT Provider Note   CSN: [id number] Arrival date & time: 12/04/18  [id number]     History   Chief Complaint Chief Complaint  Patient presents with  . Dental Pain  . Emesis    HPI Mallory [name] is a 63 y.o. female who presents to the ER with cc of Left dental pain. The patient states that her sxs began about 11 days ago. The patient  States that she had a telehealth visit last ' Saturday. She began takeing prescribed amoxiciliin. The next day she had several episodes of vomitng and then went to the ER in [place] where she lives. She was given Zofran and told to follow up with her pcp for dental or Oral surgery follow up. Since that time her symptoms have been worsening. She complains of facial swelling and pain below her left jaw.She has severe trismus and states that she has only been able to take in liquids through a straw because she cannot open her mouth. She has been able to take her amoxil bcause she can break the pill into tiny pieces. She states that her PCP tried to get her into follow up but no one had any appointments for at least 6 weeks. Her PCP told her to come come here where we might be able to help her because he felt like that was too long to wait. She denies fever or difficulty swallowing.     HPI  Past Medical History:  Diagnosis Date  . Asthma   . Depression   . Hyperlipidemia   . Hypertension     Patient Active Problem List   Diagnosis Date Noted  . Chronic rhinitis 07/23/2011  . Pulmonary emphysema ([place]) 04/02/2011  . Smoker 04/02/2011  . Essential hypertension   . Hyperlipidemia     Past Surgical History:  Procedure Laterality Date  . ABDOMINAL HYSTERECTOMY       OB History   No obstetric history on file.      Home Medications    Prior to Admission medications   Medication Sig Start Date End Date Taking? Authorizing Provider  amLODipine (NORVASC) 5 MG tablet Take 1 tablet (5 mg total) by mouth  daily. For blood pressure 07/29/18   Claretta Fraise, MD  diclofenac (VOLTAREN) 75 MG EC tablet Take 1 tablet (75 mg total) by mouth 2 (two) times daily. For muscle and  Joint pain 07/28/18   Claretta Fraise, MD  fluticasone furoate-vilanterol (BREO ELLIPTA) 100-25 MCG/INH AEPB Inhale 1 puff into the lungs daily. Patient not taking: Reported on 07/28/2018 12/17/17   Claretta Fraise, MD  olmesartan-hydrochlorothiazide (BENICAR HCT) 20-12.5 MG tablet TAKE 1 TABLET BY MOUTH EVERY DAY IN THE MORNING for blood pressure 07/29/18   Claretta Fraise, MD    Family History Family History  Problem Relation Age of Onset  . Cancer Sister        lymphoma  . Cancer Brother        colon    Social History Social History   Tobacco Use  . Smoking status: Current Every Day Smoker    Packs/day: 0.25    Years: 40.00    Pack years: 10.00    Types: Cigarettes  . Smokeless tobacco: Never Used  Substance Use Topics  . Alcohol use: No  . Drug use: No     Allergies   Aspirin [aspirin]   Review of Systems Review of Systems  Ten systems reviewed and are negative for acute change, except as  noted in the HPI.   Physical Exam Updated Vital Signs BP (!) 146/93   Pulse 92   Temp 98.3 F (36.8 C) (Oral)   Resp 16   Ht 5' 1.5" (1.562 m)   Wt 39 kg   SpO2 96%   BMI 15.99 kg/m   Physical Exam Vitals signs and nursing note reviewed.  Constitutional:      General: She is not in acute distress.    Appearance: She is well-developed. She is not diaphoretic.  HENT:     Head: Normocephalic and atraumatic.     Jaw: Trismus, tenderness, swelling and pain on movement present. No malocclusion.      Comments: Exam limited by severe trismus. Unable to open mouth more that 1 cm.  Sub mandibular lymphadenopathy on the left.  Eyes:     General: No scleral icterus.    Conjunctiva/sclera: Conjunctivae normal.  Neck:     Musculoskeletal: Normal range of motion.  Cardiovascular:     Rate and Rhythm: Normal rate  and regular rhythm.     Heart sounds: Normal heart sounds. No murmur. No friction rub. No gallop.   Pulmonary:     Effort: Pulmonary effort is normal. No respiratory distress.     Breath sounds: Normal breath sounds.  Abdominal:     General: Bowel sounds are normal. There is no distension.     Palpations: Abdomen is soft. There is no mass.     Tenderness: There is no abdominal tenderness. There is no guarding.  Skin:    General: Skin is warm and dry.  Neurological:     Mental Status: She is alert and oriented to person, place, and time.  Psychiatric:        Behavior: Behavior normal.      ED Treatments / Results  Labs (all labs ordered are listed, but only abnormal results are displayed) Labs Reviewed  COMPREHENSIVE METABOLIC PANEL - Abnormal; Notable for the following components:      Result Value   Chloride 97 (*)    Glucose, Bld 105 (*)    Albumin 3.4 (*)    All other components within normal limits  LACTIC ACID, PLASMA  CBC WITH DIFFERENTIAL/PLATELET    EKG None  Radiology No results found.  Procedures Procedures (including critical care time)  Medications Ordered in ED Medications  dexamethasone (DECADRON) injection 10 mg (10 mg Intravenous Given 12/04/18 1640)  ketorolac (TORADOL) 30 MG/ML injection 15 mg (15 mg Intravenous Given 12/04/18 1642)     Initial Impression / Assessment and Plan / ED Course  I have reviewed the triage vital signs and the nursing notes.  Pertinent labs & imaging results that were available during my care of the patient were reviewed by me and considered in my medical decision making (see chart for details).        This is a 63 year old female here with trismus and dental infection.  Has slightly elevated blood glucose, no leukocytosis, lactic acid within normal limits.  I personally reviewed the patient's CT maxillofacial which shows a nasal mass and no acute abscess or fluid collection however poor dentition throughout.  I have  discussed this with the patient.  Patient feels incredibly improved after steroids and anti-inflammatory medications.  We will continue with this  regimen.  I have given her multiple referrals to maxillofacial surgery.  Patient is in agreement with plan of care and appears appropriate for discharge at this time on antibiotics.  Discussed return precautions.  Final Clinical  Impressions(s) / ED Diagnoses     Final diagnoses:  None    ED Discharge Orders    None       Margarita Mail, PA-C 12/06/18 0012    Valarie Merino, MD 12/09/18 1451

## 2018-12-04 NOTE — Discharge Instructions (Signed)
Contact a health care provider if: Your pain is worse and is not helped by medicine. Get help right away if: You have a fever or chills. Your symptoms suddenly get worse. You have a very bad headache. You have problems breathing or swallowing. You have trouble opening your mouth. You have swelling in your neck or around your eye.

## 2018-12-04 NOTE — ED Triage Notes (Signed)
Pt to ER for evaluation of left dental abscesses. Has been on amoxicillin and now has been vomiting and unable to keep food down since last Saturday. Reports antibiotics aren't helping, her face continues to swell.

## 2018-12-05 ENCOUNTER — Encounter: Payer: Self-pay | Admitting: Family Medicine

## 2018-12-05 NOTE — Progress Notes (Signed)
    Subjective:    Patient ID: Mallory [name], female    DOB: 26-Dec-1955, 63 y.o.   MRN: [id number]   HPI: Mallory [name] is a 63 y.o. female presenting for pain from abscessing tooth.She put a q-tip in the mouth on the tooth and  Found it to be bloody on withdrawal.  Needs referral to oral surgeon. However due to pain 9/10 her need is immediate.She went to E.D. at [facility] and was told to call PCP for oral surgery referral.    Depression screen [facility] 2/9 07/28/2018 12/17/2017 11/11/2016 10/14/2016 09/17/2016  Decreased Interest 0 0 0 0 0  Down, Depressed, Hopeless 0 0 0 0 0  PHQ - 2 Score 0 0 0 0 0  Altered sleeping 0 - - - -  Tired, decreased energy 0 - - - -  Change in appetite 0 - - - -  Feeling bad or failure about yourself  0 - - - -  Trouble concentrating 0 - - - -  Moving slowly or fidgety/restless 0 - - - -  Suicidal thoughts 0 - - - -  PHQ-9 Score 0 - - - -     Relevant past medical, surgical, family and social history reviewed and updated as indicated.  Interim medical history since our last visit reviewed. Allergies and medications reviewed and updated.  ROS:  Review of Systems  Constitutional: Negative for chills, diaphoresis and fever.  Respiratory: Negative.   Cardiovascular: Negative.   Gastrointestinal: Negative.      Social History   Tobacco Use  Smoking Status Current Every Day Smoker  . Packs/day: 0.25  . Years: 40.00  . Pack years: 10.00  . Types: Cigarettes  Smokeless Tobacco Never Used       Objective:     Wt Readings from Last 3 Encounters:  12/04/18 86 lb (39 kg)  07/28/18 90 lb 9.6 oz (41.1 kg)  12/17/17 90 lb (40.8 kg)     Exam deferred. Pt. Harboring due to COVID 19. Phone visit performed.   Assessment & Plan:   1. Dental abscess     Diagnoses and all orders for this visit:  Dental abscess  Referred to E.D. at [facility]. They should be able to provide on call oral surgeon consult due to acute need.  Virtual Visit via  telephone Note  I discussed the limitations, risks, security and privacy concerns of performing an evaluation and management service by telephone and the availability of in person appointments. The patient was identified with two identifiers. Pt.expressed understanding and agreed to proceed. Pt. Is at home. Dr. Livia Snellen is in his office.  Follow Up Instructions:   I discussed the assessment and treatment plan with the patient. The patient was provided an opportunity to ask questions and all were answered. The patient agreed with the plan and demonstrated an understanding of the instructions.   The patient was advised to call back or seek an in-person evaluation if the symptoms worsen or if the condition [name] to improve as anticipated.   Total minutes including chart review and phone contact time: 11   Follow up plan: Return if symptoms worsen or fail to improve.  Claretta Fraise, MD [place]

## 2019-03-09 ENCOUNTER — Other Ambulatory Visit: Payer: Self-pay | Admitting: Family Medicine

## 2019-03-23 ENCOUNTER — Other Ambulatory Visit: Payer: Self-pay | Admitting: Family Medicine

## 2019-03-24 NOTE — Telephone Encounter (Signed)
[facility]. NTBS 30 days given 03/09/19

## 2019-04-25 ENCOUNTER — Other Ambulatory Visit: Payer: Self-pay | Admitting: Family Medicine

## 2019-05-04 ENCOUNTER — Other Ambulatory Visit: Payer: Self-pay | Admitting: Family Medicine

## 2019-05-04 NOTE — Telephone Encounter (Signed)
rx refill request denied 30 day supply was given 03/23/19 and denied 04/25/19 ntbs for refills  Pt scheduled with [facility] 05/12/19 at 9:41 for follow up. She states she has enough medicine to get her to appt and doesn't require refill at this time.

## 2019-05-11 ENCOUNTER — Other Ambulatory Visit: Payer: Self-pay

## 2019-05-12 ENCOUNTER — Ambulatory Visit: Payer: 59 | Admitting: Family Medicine

## 2019-05-12 ENCOUNTER — Other Ambulatory Visit: Payer: Self-pay

## 2019-05-12 ENCOUNTER — Encounter: Payer: Self-pay | Admitting: Family Medicine

## 2019-05-12 VITALS — BP 114/81 | HR 100 | Temp 98.0°F | Ht 61.5 in | Wt 90.6 lb

## 2019-05-12 DIAGNOSIS — M0579 Rheumatoid arthritis with rheumatoid factor of multiple sites without organ or systems involvement: Secondary | ICD-10-CM | POA: Diagnosis not present

## 2019-05-12 DIAGNOSIS — J439 Emphysema, unspecified: Secondary | ICD-10-CM | POA: Diagnosis not present

## 2019-05-12 DIAGNOSIS — E785 Hyperlipidemia, unspecified: Secondary | ICD-10-CM

## 2019-05-12 DIAGNOSIS — I10 Essential (primary) hypertension: Secondary | ICD-10-CM

## 2019-05-12 MED ORDER — ALBUTEROL SULFATE HFA 108 (90 BASE) MCG/ACT IN AERS: 2.0000 | INHALATION_SPRAY | RESPIRATORY_TRACT | 11 refills | Status: DC | PRN

## 2019-05-12 MED ORDER — OLMESARTAN MEDOXOMIL-HCTZ 20-12.5 MG PO TABS: ORAL_TABLET | ORAL | 0 refills | Status: DC

## 2019-05-12 MED ORDER — MELOXICAM 15 MG PO TABS: 15.0000 mg | ORAL_TABLET | Freq: Every day | ORAL | 0 refills | Status: DC

## 2019-05-12 MED ORDER — AMLODIPINE BESYLATE 5 MG PO TABS: 5.0000 mg | ORAL_TABLET | Freq: Every day | ORAL | 3 refills | Status: DC

## 2019-05-12 MED ORDER — BREO ELLIPTA 100-25 MCG/INH IN AEPB: 1.0000 | INHALATION_SPRAY | Freq: Every day | RESPIRATORY_TRACT | 11 refills | Status: DC

## 2019-05-12 NOTE — Progress Notes (Signed)
Subjective:  Patient ID: Mallory [name], female    DOB: 1955-09-19  Age: 64 y.o. MRN: [id number]  CC: Follow-up (6 month)   HPI Mallory [name] presents for follow-up of hypertension. Patient has no history of headache chest pain or shortness of breath or recent cough. Patient also denies symptoms of TIA such as numbness weakness lateralizing. Patient checks  blood pressure at home and has not had any elevated readings recently. Patient denies side effects from his medication. States taking it regularly.   Patient also has emphysema.  Mallory [name] gets short of breath occasionally with exertion.  Mallory [name] is not using her Breo daily Mallory [name] is using it as needed.  Patient does continue to smoke.  Patient also was diagnosed with rheumatoid arthritis and referred to rheumatology.  Although Mallory [name] continues to take meloxicam, Mallory [name] is also taking Plaquenil and Simponi Aria that are managed by her rheumatologist.    Depression screen [facility] 2/9 05/12/2019 07/28/2018 12/17/2017  Decreased Interest 0 0 0  Down, Depressed, Hopeless 0 0 0  PHQ - 2 Score 0 0 0  Altered sleeping - 0 -  Tired, decreased energy - 0 -  Change in appetite - 0 -  Feeling bad or failure about yourself  - 0 -  Trouble concentrating - 0 -  Moving slowly or fidgety/restless - 0 -  Suicidal thoughts - 0 -  PHQ-9 Score - 0 -    History Mallory [name] has a past medical history of Asthma, Depression, Hyperlipidemia, and Hypertension.   Mallory [name] has a past surgical history that includes Abdominal hysterectomy.   Her family history includes Cancer in her brother and sister.Mallory [name] reports that Mallory [name] has been smoking cigarettes. Mallory [name] has a 10.00 pack-year smoking history. Mallory [name] has never used smokeless tobacco. Mallory [name] reports that Mallory [name] does not drink alcohol or use drugs.    ROS Review of Systems  Constitutional: Negative.   HENT: Negative.   Eyes: Negative for visual disturbance.  Respiratory: Negative for shortness of breath.   Cardiovascular: Negative for  chest pain.  Gastrointestinal: Negative for abdominal pain.  Musculoskeletal: Positive for arthralgias (Multiple areas, primarily today Mallory [name] mentions her hands Mallory [name] cannot wear rings anymore because of the joint hypertrophy of her PIP joints.).    Objective:  BP 114/81   Pulse 100   Temp 98 F (36.7 C) (Temporal)   Ht 5' 1.5" (1.562 m)   Wt 90 lb 9.6 oz (41.1 kg)   BMI 16.84 kg/m   BP Readings from Last 3 Encounters:  05/12/19 114/81  12/04/18 (!) 135/95  07/28/18 125/84    Wt Readings from Last 3 Encounters:  05/12/19 90 lb 9.6 oz (41.1 kg)  12/04/18 86 lb (39 kg)  07/28/18 90 lb 9.6 oz (41.1 kg)     Physical Exam Constitutional:      General: Mallory [name] is not in acute distress.    Appearance: Mallory [name] is well-developed.  HENT:     Head: Normocephalic and atraumatic.     Right Ear: External ear normal.     Left Ear: External ear normal.     Nose: Nose normal.  Eyes:     Conjunctiva/sclera: Conjunctivae normal.     Pupils: Pupils are equal, round, and reactive to light.  Neck:     Thyroid: No thyromegaly.  Cardiovascular:     Rate and Rhythm: Normal rate and regular rhythm.     Heart sounds: Normal heart sounds. No murmur.  Pulmonary:     Effort: Pulmonary effort is  normal. No respiratory distress.     Breath sounds: Normal breath sounds. No wheezing or rales.  Abdominal:     General: Bowel sounds are normal. There is no distension.     Palpations: Abdomen is soft.     Tenderness: There is no abdominal tenderness.  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 Normal range of motion.     Cervical back: Normal range of motion and neck supple.  Lymphadenopathy:     Cervical: No cervical adenopathy.  Skin:    General: Skin is warm and dry.  Neurological:     Mental Status: Mallory [name] is alert and oriented to person, place, and time.     Deep Tendon Reflexes: Reflexes are normal and symmetric.  Psychiatric:        Behavior:  Behavior normal.        Thought Content: Thought content normal.        Judgment: Judgment normal.       Assessment & Plan:   Mallory [name] was seen today for follow-up.  Diagnoses and all orders for this visit:  Pulmonary emphysema, unspecified emphysema type ([place]) -     CBC with Differential/Platelet -     CMP14+EGFR -     albuterol (PROAIR HFA) 108 (90 Base) MCG/ACT inhaler; Inhale 2 puffs into the lungs as needed. -     fluticasone furoate-vilanterol (BREO ELLIPTA) 100-25 MCG/INH AEPB; Inhale 1 puff into the lungs daily.  Essential hypertension -     CBC with Differential/Platelet -     CMP14+EGFR -     Lipid panel -     amLODipine (NORVASC) 5 MG tablet; Take 1 tablet (5 mg total) by mouth daily. For blood pressure -     olmesartan-hydrochlorothiazide (BENICAR HCT) 20-12.5 MG tablet; TAKE 1 TABLET EVERY DAY IN THE MORNING FOR BLOOD PRESSURE (NEEDS TO BE SEEN BEFORE NEXT REFILL)  Hyperlipidemia, unspecified hyperlipidemia type -     CBC with Differential/Platelet -     CMP14+EGFR -     Lipid panel  Rheumatoid arthritis involving multiple sites with positive rheumatoid factor (HCC) -     meloxicam (MOBIC) 15 MG tablet; Take 1 tablet (15 mg total) by mouth daily. Take 1 daily with food.  Patient has experienced significant relief with her recent condition of the Simponi for her rheumatoid arthritis.  Mallory [name] still has some joint pains but has only had 2 infusions so far.  Unfortunately Mallory [name] continues to smoke.  Mallory [name] is using the [facility] Series as needed and was encouraged to try it every day and use the albuterol for her as needed breakthroughs.  Encouraged her that this would help her overall in the long run even if Mallory [name] did not seem to need it constantly.  Additionally Mallory [name] was encouraged to stop smoking.  I have discontinued Yanely C. Buhman's amoxicillin-clavulanate, HYDROcodone-acetaminophen, and methylPREDNISolone. I am also having her maintain her hydroxychloroquine, Simponi Aria, amLODipine,  meloxicam, olmesartan-hydrochlorothiazide, albuterol, and Breo Ellipta. We will continue to administer triamcinolone acetonide.  Allergies as of 05/12/2019      Reactions   Aspirin [aspirin] Palpitations      Medication List       Accurate as of May 12, 2019 10:49 PM. If you have any questions, ask your nurse or doctor.        STOP taking these medications   amoxicillin-clavulanate 875-125 MG tablet Commonly known as: AUGMENTIN Stopped by: Claretta Fraise, MD  HYDROcodone-acetaminophen 7.5-325 mg/15 ml solution Commonly known as: HYCET Stopped by: Claretta Fraise, MD   methylPREDNISolone 4 MG Tbpk tablet Commonly known as: MEDROL DOSEPAK Stopped by: Claretta Fraise, MD     TAKE these medications   albuterol 108 (90 Base) MCG/ACT inhaler Commonly known as: ProAir HFA Inhale 2 puffs into the lungs as needed. Started by: Claretta Fraise, MD   amLODipine 5 MG tablet Commonly known as: NORVASC Take 1 tablet (5 mg total) by mouth daily. For blood pressure   Breo Ellipta 100-25 MCG/INH Aepb Generic drug: fluticasone furoate-vilanterol Inhale 1 puff into the lungs daily. Started by: Claretta Fraise, MD   hydroxychloroquine 200 MG tablet Commonly known as: PLAQUENIL Take 200 mg by mouth daily.   meloxicam 15 MG tablet Commonly known as: Mobic Take 1 tablet (15 mg total) by mouth daily. Take 1 daily with food.   olmesartan-hydrochlorothiazide 20-12.5 MG tablet Commonly known as: BENICAR HCT TAKE 1 TABLET EVERY DAY IN THE MORNING FOR BLOOD PRESSURE (NEEDS TO BE SEEN BEFORE NEXT REFILL)   Simponi Aria 50 MG/4ML Soln injection Generic drug: golimumab Inject into the vein.        Follow-up: Return in about 6 months (around 11/09/2019).  Claretta Fraise, M.D.

## 2019-05-13 LAB — LIPID PANEL
Chol/HDL Ratio: 2.7 ratio (ref 0.0–4.4)
Cholesterol, Total: 224 mg/dL — ABNORMAL HIGH (ref 100–199)
HDL: 84 mg/dL (ref >39)
LDL Chol Calc (NIH): 116 mg/dL — ABNORMAL HIGH (ref 0–99)
Triglycerides: 142 mg/dL (ref 0–149)
VLDL Cholesterol Cal: 24 mg/dL (ref 5–40)

## 2019-05-13 LAB — CMP14+EGFR
ALT: 15 IU/L (ref 0–32)
AST: 24 IU/L (ref 0–40)
Albumin/Globulin Ratio: 1.8 (ref 1.2–2.2)
Albumin: 4.8 g/dL (ref 3.8–4.8)
Alkaline Phosphatase: 79 IU/L (ref 39–117)
BUN/Creatinine Ratio: 18 (ref 12–28)
BUN: 19 mg/dL (ref 8–27)
Bilirubin Total: 0.5 mg/dL (ref 0.0–1.2)
CO2: 22 mmol/L (ref 20–29)
Calcium: 10.4 mg/dL — ABNORMAL HIGH (ref 8.7–10.3)
Chloride: 99 mmol/L (ref 96–106)
Creatinine, Ser: 1.05 mg/dL — ABNORMAL HIGH (ref 0.57–1.00)
GFR calc Af Amer: 66 mL/min/{1.73_m2} (ref >59)
GFR calc non Af Amer: 57 mL/min/{1.73_m2} — ABNORMAL LOW (ref >59)
Globulin, Total: 2.7 g/dL (ref 1.5–4.5)
Glucose: 96 mg/dL (ref 65–99)
Potassium: 4.2 mmol/L (ref 3.5–5.2)
Sodium: 138 mmol/L (ref 134–144)
Total Protein: 7.5 g/dL (ref 6.0–8.5)

## 2019-05-13 LAB — CBC WITH DIFFERENTIAL/PLATELET
Basophils Absolute: 0 10*3/uL (ref 0.0–0.2)
Basos: 0 %
EOS (ABSOLUTE): 0.1 10*3/uL (ref 0.0–0.4)
Eos: 2 %
Hematocrit: 43.6 % (ref 34.0–46.6)
Hemoglobin: 14.7 g/dL (ref 11.1–15.9)
Immature Grans (Abs): 0 10*3/uL (ref 0.0–0.1)
Immature Granulocytes: 0 %
Lymphocytes Absolute: 3.1 10*3/uL (ref 0.7–3.1)
Lymphs: 40 %
MCH: 31.5 pg (ref 26.6–33.0)
MCHC: 33.7 g/dL (ref 31.5–35.7)
MCV: 94 fL (ref 79–97)
Monocytes Absolute: 0.7 10*3/uL (ref 0.1–0.9)
Monocytes: 9 %
Neutrophils Absolute: 3.9 10*3/uL (ref 1.4–7.0)
Neutrophils: 49 %
Platelets: 325 10*3/uL (ref 150–450)
RBC: 4.66 x10E6/uL (ref 3.77–5.28)
RDW: 13.1 % (ref 11.7–15.4)
WBC: 7.9 10*3/uL (ref 3.4–10.8)

## 2019-06-03 ENCOUNTER — Other Ambulatory Visit: Payer: Self-pay | Admitting: Family Medicine

## 2019-06-03 DIAGNOSIS — I10 Essential (primary) hypertension: Secondary | ICD-10-CM

## 2019-06-07 ENCOUNTER — Ambulatory Visit: Payer: 59 | Attending: Internal Medicine

## 2019-06-07 DIAGNOSIS — Z23 Encounter for immunization: Secondary | ICD-10-CM

## 2019-06-07 NOTE — Progress Notes (Signed)
   Covid-19 Vaccination Clinic  Name:  LAKINA CORLESS    MRN: [id number] DOB: 02-05-1956  06/07/2019  Ms. Biers was observed post Covid-19 immunization for 15 minutes without incident. She was provided with Vaccine Information Sheet and instruction to access the V-Safe system.   Ms. Kincade was instructed to call 911 with any severe reactions post vaccine: Marland Kitchen Difficulty breathing  . Swelling of face and throat  . A fast heartbeat  . A bad rash all over body  . Dizziness and weakness   Immunizations Administered    Name Date Dose VIS Date Route   Pfizer COVID-19 Vaccine 06/07/2019 10:43 AM 0.3 mL 02/26/2019 Intramuscular   Manufacturer: [place]   Lot: [id number]   [place]: [id number]

## 2019-06-25 ENCOUNTER — Ambulatory Visit: Payer: 59 | Attending: Internal Medicine

## 2019-06-25 DIAGNOSIS — Z23 Encounter for immunization: Secondary | ICD-10-CM

## 2019-06-25 NOTE — Progress Notes (Signed)
   Covid-19 Vaccination Clinic  Name:  Mallory [name]    MRN: [id number] DOB: February 26, 1956  06/25/2019  Ms. Pleasant was observed post Covid-19 immunization for 15 minutes without incident. She was provided with Vaccine Information Sheet and instruction to access the V-Safe system.   [name] was instructed to call 911 with any severe reactions post vaccine: Marland Kitchen Difficulty breathing  . Swelling of face and throat  . A fast heartbeat  . A bad rash all over body  . Dizziness and weakness   Immunizations Administered    Name Date Dose VIS Date Route   Pfizer COVID-19 Vaccine 06/25/2019 10:51 AM 0.3 mL 02/26/2019 Intramuscular   Manufacturer: [place]   Lot: [id number]   [place]: [id number]

## 2019-06-30 ENCOUNTER — Ambulatory Visit: Payer: 59

## 2019-11-09 ENCOUNTER — Ambulatory Visit: Payer: No Typology Code available for payment source | Admitting: Family Medicine

## 2019-11-09 ENCOUNTER — Encounter: Payer: Self-pay | Admitting: Family Medicine

## 2019-11-09 ENCOUNTER — Other Ambulatory Visit: Payer: Self-pay

## 2019-11-09 VITALS — BP 116/82 | HR 89 | Temp 97.4°F | Ht 61.5 in | Wt 89.4 lb

## 2019-11-09 DIAGNOSIS — M0579 Rheumatoid arthritis with rheumatoid factor of multiple sites without organ or systems involvement: Secondary | ICD-10-CM

## 2019-11-09 DIAGNOSIS — E785 Hyperlipidemia, unspecified: Secondary | ICD-10-CM | POA: Diagnosis not present

## 2019-11-09 DIAGNOSIS — I10 Essential (primary) hypertension: Secondary | ICD-10-CM

## 2019-11-09 DIAGNOSIS — F17200 Nicotine dependence, unspecified, uncomplicated: Secondary | ICD-10-CM | POA: Diagnosis not present

## 2019-11-09 MED ORDER — OLMESARTAN MEDOXOMIL-HCTZ 20-12.5 MG PO TABS: ORAL_TABLET | ORAL | 1 refills | Status: DC

## 2019-11-09 NOTE — Progress Notes (Signed)
Subjective:  Patient ID: Mallory [name], female    DOB: [birth year]  Age: 64 y.o. MRN: [id number]  CC: Follow-up (6 month)   HPI Mallory [name] presents for  follow-up of hypertension. Patient has no history of headache chest pain or shortness of breath or recent cough. Patient also denies symptoms of TIA such as focal numbness or weakness. Patient denies side effects from medication. States taking it regularly.  Followed for rheumatoid arthritis by rheumatologist. Current med not, simponi, sufficient. Has appt. Upcoming. Recent fx rt hand with ORIF. Still stiff, little flex & ext. Smoker 20 pack yr hx. Denies dyspnea. Now at 5 cigarettes a day.   Has had elevated cholesterol. On no meds.   History Mallory [name] has a past medical history of Asthma, Depression, Hyperlipidemia, and Hypertension.   She has a past surgical history that includes Abdominal hysterectomy and Fracture surgery (Right).   Her family history includes Cancer in her brother and sister.She reports that she has been smoking cigarettes. She has a 10.00 pack-year smoking history. She has never used smokeless tobacco. She reports that she does not drink alcohol and does not use drugs.  Current Outpatient Medications on File Prior to Visit  Medication Sig Dispense Refill  . albuterol (PROAIR HFA) 108 (90 Base) MCG/ACT inhaler Inhale 2 puffs into the lungs as needed. 18 g 11  . amLODipine (NORVASC) 5 MG tablet Take 1 tablet (5 mg total) by mouth daily. For blood pressure 90 tablet 3  . fluticasone furoate-vilanterol (BREO ELLIPTA) 100-25 MCG/INH AEPB Inhale 1 puff into the lungs daily. 60 each 11  . golimumab (SIMPONI ARIA) 50 MG/4ML SOLN injection Inject into the vein.    . hydroxychloroquine (PLAQUENIL) 200 MG tablet Take 200 mg by mouth daily.    Marland Kitchen olmesartan-hydrochlorothiazide (BENICAR HCT) 20-12.5 MG tablet TAKE 1 TABLET EVERY DAY IN THE MORNING FOR BLOOD PRESSURE 90 tablet 1   Current Facility-Administered  Medications on File Prior to Visit  Medication Dose Route Frequency Provider Last Rate Last Admin  . triamcinolone acetonide (KENALOG-40) injection 40 mg  40 mg Intra-articular Once Timmothy Euler, MD        ROS Review of Systems  Constitutional: Negative.   HENT: Negative for congestion.   Eyes: Negative for visual disturbance.  Respiratory: Negative for shortness of breath.   Cardiovascular: Negative for chest pain.  Gastrointestinal: Negative for abdominal pain, constipation, diarrhea, nausea and vomiting.  Genitourinary: Negative for difficulty urinating.  Musculoskeletal: Positive for arthralgias, joint swelling and myalgias.  Neurological: Negative for headaches.  Psychiatric/Behavioral: Negative for sleep disturbance.    Objective:  BP 116/82   Pulse 89   Temp (!) 97.4 F (36.3 C) (Temporal)   Ht 5' 1.5" (1.562 m)   Wt 89 lb 6.4 oz (40.6 kg)   BMI 16.62 kg/m   BP Readings from Last 3 Encounters:  11/09/19 116/82  05/12/19 114/81  12/04/18 (!) 135/95    Wt Readings from Last 3 Encounters:  11/09/19 89 lb 6.4 oz (40.6 kg)  05/12/19 90 lb 9.6 oz (41.1 kg)  12/04/18 86 lb (39 kg)     Physical Exam Constitutional:      General: She is not in acute distress.    Appearance: She is well-developed.  HENT:     Head: Normocephalic and atraumatic.     Right Ear: External ear normal.     Left Ear: External ear normal.     Nose: Nose normal.  Eyes:  Conjunctiva/sclera: Conjunctivae normal.     Pupils: Pupils are equal, round, and reactive to light.  Neck:     Thyroid: No thyromegaly.  Cardiovascular:     Rate and Rhythm: Normal rate and regular rhythm.     Heart sounds: Normal heart sounds. No murmur heard.   Pulmonary:     Effort: Pulmonary effort is normal. No respiratory distress.     Breath sounds: Normal breath sounds. No wheezing or rales.  Abdominal:     General: Bowel sounds are normal. There is no distension.     Palpations: Abdomen is soft.       Tenderness: There is no abdominal tenderness.  Musculoskeletal:        General: Swelling (right hand, knuckles) present.     Cervical back: Normal range of motion and neck supple.  Lymphadenopathy:     Cervical: No cervical adenopathy.  Skin:    General: Skin is warm and dry.  Neurological:     Mental Status: She is alert and oriented to person, place, and time.     Deep Tendon Reflexes: Reflexes are normal and symmetric.  Psychiatric:        Behavior: Behavior normal.        Thought Content: Thought content normal.        Judgment: Judgment normal.       Assessment & Plan:   Vivianne was seen today for follow-up.  Diagnoses and all orders for this visit:  Hyperlipidemia, unspecified hyperlipidemia type -     CMP14+EGFR -     Lipid panel  Essential hypertension -     CMP14+EGFR -     CBC with Differential/Platelet  Smoker -     CMP14+EGFR -     CBC with Differential/Platelet  Rheumatoid arthritis involving multiple sites with positive rheumatoid factor (HCC) -     CMP14+EGFR -     CBC with Differential/Platelet   Allergies as of 11/09/2019      Reactions   Aspirin [aspirin] Palpitations      Medication List       Accurate as of November 09, 2019 10:42 PM. If you have any questions, ask your nurse or doctor.        STOP taking these medications   meloxicam 15 MG tablet Commonly known as: Mobic Stopped by: Claretta Fraise, MD     TAKE these medications   albuterol 108 (90 Base) MCG/ACT inhaler Commonly known as: ProAir HFA Inhale 2 puffs into the lungs as needed.   amLODipine 5 MG tablet Commonly known as: NORVASC Take 1 tablet (5 mg total) by mouth daily. For blood pressure   Breo Ellipta 100-25 MCG/INH Aepb Generic drug: fluticasone furoate-vilanterol Inhale 1 puff into the lungs daily.   hydroxychloroquine 200 MG tablet Commonly known as: PLAQUENIL Take 200 mg by mouth daily.   olmesartan-hydrochlorothiazide 20-12.5 MG tablet Commonly  known as: BENICAR HCT TAKE 1 TABLET EVERY DAY IN THE MORNING FOR BLOOD PRESSURE   Simponi Aria 50 MG/4ML Soln injection Generic drug: golimumab Inject into the vein.       Pt. Declines bloodwork today.  Smoking cessation couseling done.  Follow-up: No follow-ups on file.  Claretta Fraise, M.D.

## 2019-11-10 LAB — CBC WITH DIFFERENTIAL/PLATELET
Basophils Absolute: 0.1 10*3/uL (ref 0.0–0.2)
Basos: 1 %
EOS (ABSOLUTE): 0.1 10*3/uL (ref 0.0–0.4)
Eos: 2 %
Hematocrit: 39 % (ref 34.0–46.6)
Hemoglobin: 13.4 g/dL (ref 11.1–15.9)
Immature Grans (Abs): 0 10*3/uL (ref 0.0–0.1)
Immature Granulocytes: 0 %
Lymphocytes Absolute: 2.6 10*3/uL (ref 0.7–3.1)
Lymphs: 40 %
MCH: 32.3 pg (ref 26.6–33.0)
MCHC: 34.4 g/dL (ref 31.5–35.7)
MCV: 94 fL (ref 79–97)
Monocytes Absolute: 0.6 10*3/uL (ref 0.1–0.9)
Monocytes: 10 %
Neutrophils Absolute: 3.1 10*3/uL (ref 1.4–7.0)
Neutrophils: 47 %
Platelets: 367 10*3/uL (ref 150–450)
RBC: 4.15 x10E6/uL (ref 3.77–5.28)
RDW: 12.1 % (ref 11.7–15.4)
WBC: 6.5 10*3/uL (ref 3.4–10.8)

## 2019-11-10 LAB — CMP14+EGFR
ALT: 15 IU/L (ref 0–32)
AST: 19 IU/L (ref 0–40)
Albumin/Globulin Ratio: 1.7 (ref 1.2–2.2)
Albumin: 4.6 g/dL (ref 3.8–4.8)
Alkaline Phosphatase: 83 IU/L (ref 48–121)
BUN/Creatinine Ratio: 23 (ref 12–28)
BUN: 17 mg/dL (ref 8–27)
Bilirubin Total: 0.2 mg/dL (ref 0.0–1.2)
CO2: 23 mmol/L (ref 20–29)
Calcium: 10.2 mg/dL (ref 8.7–10.3)
Chloride: 102 mmol/L (ref 96–106)
Creatinine, Ser: 0.73 mg/dL (ref 0.57–1.00)
GFR calc Af Amer: 101 mL/min/{1.73_m2} (ref >59)
GFR calc non Af Amer: 88 mL/min/{1.73_m2} (ref >59)
Globulin, Total: 2.7 g/dL (ref 1.5–4.5)
Glucose: 96 mg/dL (ref 65–99)
Potassium: 4.5 mmol/L (ref 3.5–5.2)
Sodium: 140 mmol/L (ref 134–144)
Total Protein: 7.3 g/dL (ref 6.0–8.5)

## 2019-11-10 LAB — LIPID PANEL
Chol/HDL Ratio: 3.2 ratio (ref 0.0–4.4)
Cholesterol, Total: 202 mg/dL — ABNORMAL HIGH (ref 100–199)
HDL: 63 mg/dL (ref >39)
LDL Chol Calc (NIH): 114 mg/dL — ABNORMAL HIGH (ref 0–99)
Triglycerides: 145 mg/dL (ref 0–149)
VLDL Cholesterol Cal: 25 mg/dL (ref 5–40)

## 2019-11-10 NOTE — Progress Notes (Signed)
Hello Lamonda,  Your lab result is normal and/or stable.Some minor variations that are not significant are commonly marked abnormal, but do not represent any medical problem for you.  Best regards, Telina Kleckley, M.D.

## 2020-01-01 ENCOUNTER — Ambulatory Visit: Payer: Self-pay | Attending: Internal Medicine

## 2020-01-01 DIAGNOSIS — Z23 Encounter for immunization: Secondary | ICD-10-CM

## 2020-01-01 NOTE — Progress Notes (Signed)
   Covid-19 Vaccination Clinic  Name:  KYRIN GARN    MRN: [id number] DOB: February 10, 1956  01/01/2020  Ms. Labrake was observed post Covid-19 immunization for 15 minutes without incident. She was provided with Vaccine Information Sheet and instruction to access the V-Safe system.   Ms. Bjorn was instructed to call 911 with any severe reactions post vaccine: Marland Kitchen Difficulty breathing  . Swelling of face and throat  . A fast heartbeat  . A bad rash all over body  . Dizziness and weakness

## 2020-05-11 ENCOUNTER — Other Ambulatory Visit: Payer: Self-pay

## 2020-05-11 ENCOUNTER — Ambulatory Visit: Payer: No Typology Code available for payment source | Admitting: Family Medicine

## 2020-05-11 ENCOUNTER — Encounter: Payer: Self-pay | Admitting: Family Medicine

## 2020-05-11 VITALS — BP 123/80 | HR 81 | Temp 97.2°F | Resp 20 | Ht 61.5 in | Wt 88.2 lb

## 2020-05-11 DIAGNOSIS — K5521 Angiodysplasia of colon with hemorrhage: Secondary | ICD-10-CM

## 2020-05-11 DIAGNOSIS — I10 Essential (primary) hypertension: Secondary | ICD-10-CM

## 2020-05-11 DIAGNOSIS — E785 Hyperlipidemia, unspecified: Secondary | ICD-10-CM | POA: Diagnosis not present

## 2020-05-11 NOTE — Progress Notes (Signed)
Subjective:  Patient ID: Mallory [name],  female    DOB: 06-06-1955  Age: 65 y.o.    CC: Medical Management of Chronic Issues   HPI Mallory [name] presents for  follow-up of hypertension. Patient has no history of headache chest pain or shortness of breath or recent cough. Patient also denies symptoms of TIA such as numbness weakness lateralizing. Patient denies side effects from medication. States taking it regularly.  Patient also  in for follow-up of elevated cholesterol. Doing well without complaints on current medication. Denies side effects  including myalgia and arthralgia and nausea. Also in today for liver function testing. Currently no chest pain, shortness of breath or other cardiovascular related symptoms noted.  Patient went to the emergency room for bleeding.  She had rectal bleeding on New Year's Eve with 2 bowel movements.  She had a CTA.  That report was reviewed and is now attached.  It was done at [facility].  It shows some angiodysplasia at the rectum.  She is going to be scheduled for a colonoscopy.  She has a little bit of loss of appetite.  Will get the GI doctor to look into that for her as well.  Her weight seems stable although she does tend to be slender.  History Mallory [name] has a past medical history of Asthma, Depression, Hyperlipidemia, and Hypertension.   She has a past surgical history that includes Abdominal hysterectomy and Fracture surgery (Right).   Her family history includes Cancer in her brother and sister.She reports that she has been smoking cigarettes. She has a 10.00 pack-year smoking history. She has never used smokeless tobacco. She reports that she does not drink alcohol and does not use drugs.  Current Outpatient Medications on File Prior to Visit  Medication Sig Dispense Refill  . albuterol (PROAIR HFA) 108 (90 Base) MCG/ACT inhaler Inhale 2 puffs into the lungs as needed. 18 g 11  . amLODipine (NORVASC) 5 MG tablet Take 1 tablet (5 mg  total) by mouth daily. For blood pressure 90 tablet 3  . fluticasone furoate-vilanterol (BREO ELLIPTA) 100-25 MCG/INH AEPB Inhale 1 puff into the lungs daily. 60 each 11  . golimumab (SIMPONI ARIA) 50 MG/4ML SOLN injection Inject into the vein.    Marland Kitchen leflunomide (ARAVA) 10 MG tablet Take 10 mg by mouth daily.    Marland Kitchen olmesartan-hydrochlorothiazide (BENICAR HCT) 20-12.5 MG tablet TAKE 1 TABLET EVERY DAY IN THE MORNING FOR BLOOD PRESSURE 90 tablet 1   Current Facility-Administered Medications on File Prior to Visit  Medication Dose Route Frequency Provider Last Rate Last Admin  . triamcinolone acetonide (KENALOG-40) injection 40 mg  40 mg Intra-articular Once Timmothy Euler, MD        ROS Review of Systems  Constitutional: Positive for appetite change.  HENT: Negative.   Eyes: Negative for visual disturbance.  Respiratory: Negative for shortness of breath.   Cardiovascular: Negative for chest pain.  Gastrointestinal: Negative for abdominal pain, anal bleeding, blood in stool, constipation, diarrhea, nausea, rectal pain and vomiting.  Musculoskeletal: Negative for arthralgias.    Objective:  BP 123/80   Pulse 81   Temp (!) 97.2 F (36.2 C) (Temporal)   Resp 20   Ht 5' 1.5" (1.562 m)   Wt 88 lb 4 oz (40 kg)   SpO2 96%   BMI 16.40 kg/m   BP Readings from Last 3 Encounters:  05/11/20 123/80  11/09/19 116/82  05/12/19 114/81    Wt Readings from Last 3 Encounters:  05/11/20 88 lb 4 oz (40 kg)  11/09/19 89 lb 6.4 oz (40.6 kg)  05/12/19 90 lb 9.6 oz (41.1 kg)     Physical Exam Constitutional:      General: She is not in acute distress.    Appearance: She is well-developed.  HENT:     Head: Normocephalic and atraumatic.  Eyes:     Conjunctiva/sclera: Conjunctivae normal.     Pupils: Pupils are equal, round, and reactive to light.  Neck:     Thyroid: No thyromegaly.  Cardiovascular:     Rate and Rhythm: Normal rate and regular rhythm.     Heart sounds: Normal heart  sounds. No murmur heard.   Pulmonary:     Effort: Pulmonary effort is normal. No respiratory distress.     Breath sounds: Normal breath sounds. No wheezing or rales.  Abdominal:     General: Bowel sounds are normal. There is no distension.     Palpations: Abdomen is soft.     Tenderness: There is no abdominal tenderness.  Musculoskeletal:        General: Normal range of motion.     Cervical back: Normal range of motion and neck supple.  Lymphadenopathy:     Cervical: No cervical adenopathy.  Skin:    General: Skin is warm and dry.  Neurological:     Mental Status: She is alert and oriented to person, place, and time.  Psychiatric:        Behavior: Behavior normal.        Thought Content: Thought content normal.        Judgment: Judgment normal.     Assessment & Plan:   Mallory [name] was seen today for medical management of chronic issues.  Diagnoses and all orders for this visit:  Essential hypertension -     CBC with Differential/Platelet -     CMP14+EGFR -     Lipid panel  Hyperlipidemia, unspecified hyperlipidemia type -     CBC with Differential/Platelet -     CMP14+EGFR -     Lipid panel  Angiodysplasia of colon with hemorrhage -     Ambulatory referral to Gastroenterology   I have discontinued Zonia C. Millikan's hydroxychloroquine. I am also having her maintain her Simponi Aria, amLODipine, albuterol, Breo Ellipta, olmesartan-hydrochlorothiazide, and leflunomide. We will continue to administer triamcinolone acetonide.  No orders of the defined types were placed in this encounter.    Follow-up: Return in about 6 months (around 11/08/2020), or if symptoms worsen or fail to improve.  Mallory [name], M.D.

## 2020-05-12 LAB — CBC WITH DIFFERENTIAL/PLATELET
Basophils Absolute: 0.1 10*3/uL (ref 0.0–0.2)
Basos: 1 %
EOS (ABSOLUTE): 0.1 10*3/uL (ref 0.0–0.4)
Eos: 2 %
Hematocrit: 40.4 % (ref 34.0–46.6)
Hemoglobin: 13.6 g/dL (ref 11.1–15.9)
Immature Grans (Abs): 0 10*3/uL (ref 0.0–0.1)
Immature Granulocytes: 0 %
Lymphocytes Absolute: 2.6 10*3/uL (ref 0.7–3.1)
Lymphs: 40 %
MCH: 31.6 pg (ref 26.6–33.0)
MCHC: 33.7 g/dL (ref 31.5–35.7)
MCV: 94 fL (ref 79–97)
Monocytes Absolute: 0.8 10*3/uL (ref 0.1–0.9)
Monocytes: 12 %
Neutrophils Absolute: 2.9 10*3/uL (ref 1.4–7.0)
Neutrophils: 45 %
Platelets: 304 10*3/uL (ref 150–450)
RBC: 4.3 x10E6/uL (ref 3.77–5.28)
RDW: 11.9 % (ref 11.7–15.4)
WBC: 6.4 10*3/uL (ref 3.4–10.8)

## 2020-05-12 LAB — CMP14+EGFR
ALT: 16 IU/L (ref 0–32)
AST: 25 IU/L (ref 0–40)
Albumin/Globulin Ratio: 1.7 (ref 1.2–2.2)
Albumin: 4.6 g/dL (ref 3.8–4.8)
Alkaline Phosphatase: 84 IU/L (ref 44–121)
BUN/Creatinine Ratio: 24 (ref 12–28)
BUN: 23 mg/dL (ref 8–27)
Bilirubin Total: 0.4 mg/dL (ref 0.0–1.2)
CO2: 20 mmol/L (ref 20–29)
Calcium: 10 mg/dL (ref 8.7–10.3)
Chloride: 100 mmol/L (ref 96–106)
Creatinine, Ser: 0.95 mg/dL (ref 0.57–1.00)
GFR calc Af Amer: 74 mL/min/{1.73_m2} (ref >59)
GFR calc non Af Amer: 64 mL/min/{1.73_m2} (ref >59)
Globulin, Total: 2.7 g/dL (ref 1.5–4.5)
Glucose: 97 mg/dL (ref 65–99)
Potassium: 4.3 mmol/L (ref 3.5–5.2)
Sodium: 138 mmol/L (ref 134–144)
Total Protein: 7.3 g/dL (ref 6.0–8.5)

## 2020-05-12 LAB — LIPID PANEL
Chol/HDL Ratio: 3.6 ratio (ref 0.0–4.4)
Cholesterol, Total: 238 mg/dL — ABNORMAL HIGH (ref 100–199)
HDL: 67 mg/dL (ref >39)
LDL Chol Calc (NIH): 140 mg/dL — ABNORMAL HIGH (ref 0–99)
Triglycerides: 178 mg/dL — ABNORMAL HIGH (ref 0–149)
VLDL Cholesterol Cal: 31 mg/dL (ref 5–40)

## 2020-05-19 ENCOUNTER — Other Ambulatory Visit: Payer: Self-pay | Admitting: Family Medicine

## 2020-05-19 ENCOUNTER — Telehealth: Payer: Self-pay

## 2020-05-19 MED ORDER — ROSUVASTATIN CALCIUM 10 MG PO TABS: 10.0000 mg | ORAL_TABLET | Freq: Every day | ORAL | 1 refills | Status: DC

## 2020-05-19 NOTE — Telephone Encounter (Signed)
Patient aware of results and will start medication.

## 2020-05-19 NOTE — Telephone Encounter (Signed)
Please review labs and advise.

## 2020-05-19 NOTE — Telephone Encounter (Signed)
Patient's labs look good except the cholesterol is higher than online.  When combining that with her age over 78 and her history of high blood pressure it would be a good idea for her to take something for the cholesterol.  I sent in a prescription for Crestor.  We should recheck her cholesterol next time she is in for a checkup

## 2020-06-06 ENCOUNTER — Other Ambulatory Visit: Payer: Self-pay | Admitting: Family Medicine

## 2020-06-06 DIAGNOSIS — I10 Essential (primary) hypertension: Secondary | ICD-10-CM

## 2020-06-07 ENCOUNTER — Encounter: Payer: Self-pay | Admitting: Gastroenterology

## 2020-06-07 ENCOUNTER — Ambulatory Visit: Payer: No Typology Code available for payment source | Admitting: Gastroenterology

## 2020-06-07 ENCOUNTER — Other Ambulatory Visit: Payer: Self-pay

## 2020-06-07 VITALS — BP 110/70 | HR 100 | Ht 61.5 in | Wt 89.0 lb

## 2020-06-07 DIAGNOSIS — R933 Abnormal findings on diagnostic imaging of other parts of digestive tract: Secondary | ICD-10-CM

## 2020-06-07 DIAGNOSIS — K921 Melena: Secondary | ICD-10-CM | POA: Diagnosis not present

## 2020-06-07 MED ORDER — PLENVU 140 G PO SOLR: 140.0000 g | ORAL | 0 refills | Status: DC

## 2020-06-07 NOTE — Patient Instructions (Addendum)
It was a pleasure to meet you today.  I have recommended a colonoscopy and an upper endoscopy.  I will obtain your prior colonoscopy reports from [facility] to prepare for your procedures.   Tips for colonoscopy:  - Stay well hydrated for 3-4 days prior to the exam. This reduces nausea and dehydration.  - To prevent skin/hemorrhoid irritation - prior to wiping, put A&Dointment or vaseline on the toilet paper. - Keep a towel or pad on the bed.  - Drink  64oz of clear liquids in the morning of prep day (prior to starting the prep) to be sure that there is enough fluid to flush the colon and stay hydrated!!!! This is in addition to the fluids required for preparation. - Use of a flavored hard candy, such as grape Anise Salvo, can counteract some of the flavor of the prep and may prevent some nausea.   You have been scheduled for an endoscopy and colonoscopy. Please follow the written instructions given to you at your visit today. Please pick up your prep supplies at the pharmacy within the next 1-3 days. If you use inhalers (even only as needed), please bring them with you on the day of your procedure.  It was a pleasure to see you today!  Dr. Tarri Glenn

## 2020-06-07 NOTE — Progress Notes (Signed)
Referring Provider: Claretta Fraise, MD Primary Care Physician:  Claretta Fraise, MD  Reason for Consultation: Rectal bleeding   IMPRESSION:  Acute enteritis 03/17/20 Intermittent abdominal discomfort Recent rectal bleeding, now with bloody mucous stools Abnormal CTA showing rectal and small bowel abnormalities    - radiologist felt findings were consistent with ?AVM Diverticulosis without history of diverticulitis History of colon polyps on prior colonoscopy at [facility]    - surveillance colonoscopy due last year Family history of colon cancer (Brother with colon was colon cancer at age 25)    PLAN: - EGD to evaluate abdominal discomfort and abnormal CT - Colonoscopy due for surveillance, family history, and recent symptoms - Obtain prior colonoscopy reports from [facility]  Please see the "Patient Instructions" section for addition details about the plan.  HPI: Mallory [name] is a 65 y.o. female referred by Dr. Livia Snellen for further evaluation of rectal bleeding. The history is obtained to the patient, review of records provided by Dr. Livia Snellen, and review of her electronic health record.  She has a history of hypertension, asthma, depression, hyperlipidemia, rheumatoid arthritis on Simponi.   She was previously seen by Dr. Pamalee [facility], last in [id number].   She had two weeks of indigestion followed by 3 days of painless bloody bowel movements on New Year's Eve.  Was evaluated at [facility].  A CTA showed extensive diverticulosis but no acute diverticulitis.  There is asymmetric hypervascularity of the lower rectum to anal verge that the radiologist thought was consistent with angiodysplasia.  But there was no active hemorrhage seen.  Possible thickening seen in the small bowel most significant at the level of the duodenum and proximal jejunum. Her symptoms resolved after 2 days of antibiotics.  No further rectal bleeding until she developed bloody mucous stools with started  cholesterol medications a few weeks ago.   Labs 05/11/2020 showing normal comprehensive metabolic panel and CBC  She has had 2-3 prior colonoscopies in the past at [facility]. She has had polyps removed. She was due for surveillance last year. However, she broke her arm and didn't proceed with a colonoscopy at that time.   Her rheumatologist suggested that she have an EGD at the time of her colonoscopy.    Brother with colon was colon cancer diagnosed at age 26. No other  known family history of colon cancer or polyps. No family history of uterine/endometrial cancer, pancreatic cancer or gastric/stomach cancer.   Past Medical History:  Diagnosis Date  . Asthma   . Depression   . Hyperlipidemia   . Hypertension   . Rheumatoid arthritis [facility])     Past Surgical History:  Procedure Laterality Date  . ABDOMINAL HYSTERECTOMY    . FRACTURE SURGERY Right    Right Arm  . MOUTH SURGERY      Current Outpatient Medications  Medication Sig Dispense Refill  . albuterol (PROAIR HFA) 108 (90 Base) MCG/ACT inhaler Inhale 2 puffs into the lungs as needed. 18 g 11  . amLODipine (NORVASC) 5 MG tablet Take 1 tablet (5 mg total) by mouth daily. For blood pressure 90 tablet 3  . fluticasone furoate-vilanterol (BREO ELLIPTA) 100-25 MCG/INH AEPB Inhale 1 puff into the lungs daily. (Patient taking differently: Inhale 1 puff into the lungs daily as needed.) 60 each 11  . golimumab (SIMPONI ARIA) 50 MG/4ML SOLN injection Inject into the vein every 8 (eight) weeks.    . hydroxychloroquine (PLAQUENIL) 200 MG tablet Take 200 mg by mouth daily.    Marland Kitchen  leflunomide (ARAVA) 10 MG tablet Take 10 mg by mouth daily.    Marland Kitchen olmesartan-hydrochlorothiazide (BENICAR HCT) 20-12.5 MG tablet TAKE 1 TABLET EVERY DAY IN THE MORNING FOR BLOOD PRESSURE 90 tablet 1  . rosuvastatin (CRESTOR) 10 MG tablet Take 1 tablet (10 mg total) by mouth daily. For cholesterol 90 tablet 1   No current facility-administered medications for this  visit.    Allergies as of 06/07/2020 - Review Complete 06/07/2020  Allergen Reaction Noted  . Aspirin [aspirin] Palpitations 06/08/2010    Family History  Problem Relation Age of Onset  . Cancer Sister        lymphoma  . Cancer Brother        colon  . Stomach cancer Maternal Aunt   . Esophageal cancer Neg Hx   . Pancreatic cancer Neg Hx   . Liver cancer Neg Hx   . Liver disease Neg Hx      Physical Exam: General:   Alert,  well-nourished, pleasant and cooperative in NAD Head:  Normocephalic and atraumatic. Eyes:  Sclera clear, no icterus.   Conjunctiva pink. Ears:  Normal auditory acuity. Nose:  No deformity, discharge,  or lesions. Mouth:  No deformity or lesions.   Neck:  Supple; no masses or thyromegaly. Lungs:  Clear throughout to auscultation.   No wheezes. Heart:  Regular rate and rhythm; no murmurs. Abdomen:  Soft, thin, nontender, nondistended, normal bowel sounds, no rebound or guarding. No hepatosplenomegaly.   Rectal:  Deferred  Msk:  Symmetrical. No boney deformities LAD: No inguinal or umbilical LAD Extremities:  No clubbing or edema. Neurologic:  Alert and  oriented x4;  grossly nonfocal Skin:  Intact without significant lesions or rashes. Psych:  Alert and cooperative. Normal mood and affect.     Kimberly L. Tarri Glenn, MD, MPH 06/07/2020, 9:26 AM

## 2020-06-15 ENCOUNTER — Other Ambulatory Visit: Payer: Self-pay | Admitting: Family Medicine

## 2020-06-15 DIAGNOSIS — Z1231 Encounter for screening mammogram for malignant neoplasm of breast: Secondary | ICD-10-CM

## 2020-06-26 ENCOUNTER — Other Ambulatory Visit: Payer: Self-pay

## 2020-06-26 ENCOUNTER — Encounter: Payer: Self-pay | Admitting: Family Medicine

## 2020-06-26 ENCOUNTER — Ambulatory Visit: Payer: No Typology Code available for payment source | Admitting: Family Medicine

## 2020-06-26 VITALS — BP 99/62 | HR 97 | Temp 97.2°F | Ht 61.5 in | Wt 88.2 lb

## 2020-06-26 DIAGNOSIS — R21 Rash and other nonspecific skin eruption: Secondary | ICD-10-CM | POA: Diagnosis not present

## 2020-06-26 MED ORDER — TRIAMCINOLONE ACETONIDE 0.1 % EX CREA: 1.0000 "application " | TOPICAL_CREAM | Freq: Two times a day (BID) | CUTANEOUS | 0 refills | Status: DC

## 2020-06-26 NOTE — Patient Instructions (Signed)
Rash, Adult A rash is a change in the color of your skin. A rash can also change the way your skin feels. There are many different conditions and factors that can cause a rash. Some rashes may disappear after a few days, but some may last for a few weeks. Common causes of rashes include:  Viral infections, such as: ? Colds. ? Measles. ? Hand, foot, and mouth disease.  Bacterial infections, such as: ? Scarlet fever. ? Impetigo.  Fungal infections, such as Candida.  Allergic reactions to food, medicines, or skin care products. Follow these instructions at home: The goal of treatment is to stop the itching and keep the rash from spreading. Pay attention to any changes in your symptoms. Follow these instructions to help with your condition: Medicine Take or apply over-the-counter and prescription medicines only as told by your health care provider. These may include:  Corticosteroid creams to treat red or swollen skin.  Anti-itch lotions.  Oral allergy medicines (antihistamines).  Oral corticosteroids for severe symptoms.   Skin care  Apply cool compresses to the affected areas.  Do not scratch or rub your skin.  Avoid covering the rash. Make sure the rash is exposed to air as much as possible. Managing itching and discomfort  Avoid hot showers or baths, which can make itching worse. A cold shower may help.  Try taking a bath with: ? Epsom salts. Follow manufacturer instructions on the packaging. You can get these at your local pharmacy or grocery store. ? Baking soda. Pour a small amount into the bath as told by your health care provider. ? Colloidal oatmeal. Follow manufacturer instructions on the packaging. You can get this at your local pharmacy or grocery store.  Try applying baking soda paste to your skin. Stir water into baking soda until it reaches a paste-like consistency.  Try applying calamine lotion. This is an over-the-counter lotion that helps to relieve  itchiness.  Keep cool and out of the sun. Sweating and being hot can make itching worse. General instructions  Rest as needed.  Drink enough fluid to keep your urine pale yellow.  Wear loose-fitting clothing.  Avoid scented soaps, detergents, and perfumes. Use gentle soaps, detergents, perfumes, and other cosmetic products.  Avoid any substance that causes your rash. Keep a journal to help track what causes your rash. Write down: ? What you eat. ? What cosmetic products you use. ? What you drink. ? What you wear. This includes jewelry.  Keep all follow-up visits as told by your health care provider. This is important.   Contact a health care provider if:  You sweat at night.  You lose weight.  You urinate more than normal.  You urinate less than normal, or you notice that your urine is a darker color than usual.  You feel weak.  You vomit.  Your skin or the whites of your eyes look yellow (jaundice).  Your skin: ? Tingles. ? Is numb.  Your rash: ? Does not go away after several days. ? Gets worse.  You are: ? Unusually thirsty. ? More tired than normal.  You have: ? New symptoms. ? Pain in your abdomen. ? A fever. ? Diarrhea. Get help right away if you:  Have a fever and your symptoms suddenly get worse.  Develop confusion.  Have a severe headache or a stiff neck.  Have severe joint pains or stiffness.  Have a seizure.  Develop a rash that covers all or most of your body. The   rash may or may not be painful.  Develop blisters that: ? Are on top of the rash. ? Grow larger or grow together. ? Are painful. ? Are inside your nose or mouth.  Develop a rash that: ? Looks like purple pinprick-sized spots all over your body. ? Has a "bull's eye" or looks like a target. ? Is not related to sun exposure, is red and painful, and causes your skin to peel. Summary  A rash is a change in the color of your skin. Some rashes disappear after a few days,  but some may last for a few weeks.  The goal of treatment is to stop the itching and keep the rash from spreading.  Take or apply over-the-counter and prescription medicines only as told by your health care provider.  Contact a health care provider if you have new or worsening symptoms.  Keep all follow-up visits as told by your health care provider. This is important. This information is not intended to replace advice given to you by your health care provider. Make sure you discuss any questions you have with your health care provider. Document Revised: 06/26/2018 Document Reviewed: 10/06/2017 Elsevier Patient Education  2021 [employer]  

## 2020-06-26 NOTE — Progress Notes (Signed)
Acute Office Visit  Subjective:    Patient ID: Mallory [name], female    DOB: 07-Oct-1955, 65 y.o.   MRN: [id number]  Chief Complaint  Patient presents with  . Rash    HPI Patient is in today for a rash on her arms and trunk for 5-6 weeks. It is itchy. She denies fever or drainage. Denies new lotion, soaps, medications, or detergents. She has tried Eucerin eczema cream with a little improvement. She also tried cortisone cream and coco butter. No one else in the house has the same rash. She would like to see dermatology.   Past Medical History:  Diagnosis Date  . Asthma   . Depression   . Hyperlipidemia   . Hypertension   . Rheumatoid arthritis [facility])     Past Surgical History:  Procedure Laterality Date  . ABDOMINAL HYSTERECTOMY    . FRACTURE SURGERY Right    Right Arm  . MOUTH SURGERY      Family History  Problem Relation Age of Onset  . Cancer Sister        lymphoma  . Cancer Brother        colon  . Stomach cancer Maternal Aunt   . Esophageal cancer Neg Hx   . Pancreatic cancer Neg Hx   . Liver cancer Neg Hx   . Liver disease Neg Hx     Social History   Socioeconomic History  . Marital status: Married    Spouse name: Not on file  . Number of children: Not on file  . Years of education: Not on file  . Highest education level: Not on file  Occupational History  . Occupation: Engineer, building services  Tobacco Use  . Smoking status: Current Every Day Smoker    Packs/day: 0.25    Years: 40.00    Pack years: 10.00    Types: Cigarettes  . Smokeless tobacco: Never Used  Vaping Use  . Vaping Use: Never used  Substance and Sexual Activity  . Alcohol use: Yes    Comment: occasional  . Drug use: No  . Sexual activity: Yes    Birth control/protection: Post-menopausal, Surgical  Other Topics Concern  . Not on file  Social History Narrative  . Not on file   Social Determinants of Health   Financial Resource Strain: Not on file  Food Insecurity: Not on file   Transportation Needs: Not on file  Physical Activity: Not on file  Stress: Not on file  Social Connections: Not on file  Intimate Partner Violence: Not on file    Outpatient Medications Prior to Visit  Medication Sig Dispense Refill  . albuterol (PROAIR HFA) 108 (90 Base) MCG/ACT inhaler Inhale 2 puffs into the lungs as needed. 18 g 11  . amLODipine (NORVASC) 5 MG tablet Take 1 tablet (5 mg total) by mouth daily. For blood pressure 90 tablet 3  . fluticasone furoate-vilanterol (BREO ELLIPTA) 100-25 MCG/INH AEPB Inhale 1 puff into the lungs daily. (Patient taking differently: Inhale 1 puff into the lungs daily as needed.) 60 each 11  . golimumab (SIMPONI ARIA) 50 MG/4ML SOLN injection Inject into the vein every 8 (eight) weeks.    . hydroxychloroquine (PLAQUENIL) 200 MG tablet Take 200 mg by mouth daily.    Marland Kitchen leflunomide (ARAVA) 10 MG tablet Take 10 mg by mouth daily.    Marland Kitchen olmesartan-hydrochlorothiazide (BENICAR HCT) 20-12.5 MG tablet TAKE 1 TABLET EVERY DAY IN THE MORNING FOR BLOOD PRESSURE 90 tablet 1  . PEG-KCl-NaCl-NaSulf-Na Asc-C (  PLENVU) 140 g SOLR Take 140 g by mouth as directed. Manufacturer's coupon Universal coupon code:BIN: P2366821; GROUP: [id number]; PCN: [id number]; ID: [id number]; PAY NO MORE $50 1 each 0  . rosuvastatin (CRESTOR) 10 MG tablet Take 1 tablet (10 mg total) by mouth daily. For cholesterol 90 tablet 1   No facility-administered medications prior to visit.    Allergies  Allergen Reactions  . Aspirin [Aspirin] Palpitations    Review of Systems As per HPI.     Objective:    Physical Exam Vitals and nursing note reviewed.  Constitutional:      General: She is not in acute distress.    Appearance: She is not ill-appearing, toxic-appearing or diaphoretic.  Pulmonary:     Effort: Pulmonary effort is normal. No respiratory distress.  Skin:    General: Skin is warm and dry.     Findings: Rash present. No bruising or erythema. Rash is papular. Rash is not  crusting, pustular, scaling or vesicular.     Comments: Scattered papular rash to trunk and bilateral arms. No drainage, warmth, or erythema.   Neurological:     General: No focal deficit present.     Mental Status: She is alert and oriented to person, place, and time.  Psychiatric:        Mood and Affect: Mood normal.        Behavior: Behavior normal.     BP 99/62   Pulse 97   Temp (!) 97.2 F (36.2 C)   Ht 5' 1.5" (1.562 m)   Wt 88 lb 3.2 oz (40 kg)   SpO2 98%   BMI 16.40 kg/m  Wt Readings from Last 3 Encounters:  06/26/20 88 lb 3.2 oz (40 kg)  06/07/20 89 lb (40.4 kg)  05/11/20 88 lb 4 oz (40 kg)    Health Maintenance Due  Topic Date Due  . HIV Screening  Never done  . PAP SMEAR-Modifier  04/01/2002  . COLONOSCOPY (Pts 45-72yrs Insurance coverage will need to be confirmed)  10/17/2019    There are no preventive care reminders to display for this patient.   Lab Results  Component Value Date   TSH 3.040 09/17/2016   Lab Results  Component Value Date   WBC 6.4 05/11/2020   HGB 13.6 05/11/2020   HCT 40.4 05/11/2020   MCV 94 05/11/2020   PLT 304 05/11/2020   Lab Results  Component Value Date   NA 138 05/11/2020   K 4.3 05/11/2020   CO2 20 05/11/2020   GLUCOSE 97 05/11/2020   BUN 23 05/11/2020   CREATININE 0.95 05/11/2020   BILITOT 0.4 05/11/2020   ALKPHOS 84 05/11/2020   AST 25 05/11/2020   ALT 16 05/11/2020   PROT 7.3 05/11/2020   ALBUMIN 4.6 05/11/2020   CALCIUM 10.0 05/11/2020   ANIONGAP 11 12/04/2018   Lab Results  Component Value Date   CHOL 238 (H) 05/11/2020   Lab Results  Component Value Date   HDL 67 05/11/2020   Lab Results  Component Value Date   LDLCALC 140 (H) 05/11/2020   Lab Results  Component Value Date   TRIG 178 (H) 05/11/2020   Lab Results  Component Value Date   CHOLHDL 3.6 05/11/2020   No results found for: HGBA1C     Assessment & Plan:   Mallory [name] was seen today for rash.  Diagnoses and all orders for this  visit:  Rash Try kenalog cream as below. Discussed benadryl for itching. Referral to dermatology placed. Return  to office for new or worsening symptoms, or if symptoms persist.  -     triamcinolone cream (KENALOG) 0.1 %; Apply 1 application topically 2 (two) times daily. -     Ambulatory referral to Dermatology  The patient indicates understanding of these issues and agrees with the plan.  Gwenlyn Perking, FNP

## 2020-07-06 ENCOUNTER — Other Ambulatory Visit: Payer: Self-pay | Admitting: Family Medicine

## 2020-07-06 DIAGNOSIS — I10 Essential (primary) hypertension: Secondary | ICD-10-CM

## 2020-07-12 ENCOUNTER — Other Ambulatory Visit: Payer: Self-pay

## 2020-07-12 ENCOUNTER — Ambulatory Visit
Admission: RE | Admit: 2020-07-12 | Discharge: 2020-07-12 | Disposition: A | Discharge status: Home / Self Care | Payer: No Typology Code available for payment source | Source: Ambulatory Visit | Attending: Family Medicine | Admitting: Family Medicine

## 2020-07-12 DIAGNOSIS — Z1231 Encounter for screening mammogram for malignant neoplasm of breast: Secondary | ICD-10-CM

## 2020-08-11 ENCOUNTER — Other Ambulatory Visit: Payer: Self-pay

## 2020-08-11 ENCOUNTER — Ambulatory Visit (AMBULATORY_SURGERY_CENTER): Payer: No Typology Code available for payment source | Admitting: Gastroenterology

## 2020-08-11 ENCOUNTER — Encounter: Payer: Self-pay | Admitting: Gastroenterology

## 2020-08-11 VITALS — BP 107/71 | HR 91 | Temp 97.7°F | Resp 22 | Ht 61.5 in | Wt 89.0 lb

## 2020-08-11 DIAGNOSIS — R933 Abnormal findings on diagnostic imaging of other parts of digestive tract: Secondary | ICD-10-CM

## 2020-08-11 DIAGNOSIS — K921 Melena: Secondary | ICD-10-CM | POA: Diagnosis not present

## 2020-08-11 DIAGNOSIS — D120 Benign neoplasm of cecum: Secondary | ICD-10-CM | POA: Diagnosis not present

## 2020-08-11 DIAGNOSIS — K648 Other hemorrhoids: Secondary | ICD-10-CM

## 2020-08-11 DIAGNOSIS — K5730 Diverticulosis of large intestine without perforation or abscess without bleeding: Secondary | ICD-10-CM

## 2020-08-11 DIAGNOSIS — K2970 Gastritis, unspecified, without bleeding: Secondary | ICD-10-CM | POA: Diagnosis not present

## 2020-08-11 DIAGNOSIS — K2981 Duodenitis with bleeding: Secondary | ICD-10-CM | POA: Diagnosis not present

## 2020-08-11 MED ORDER — SODIUM CHLORIDE 0.9 % IV SOLN: 500.0000 mL | Freq: Once | INTRAVENOUS | Status: DC

## 2020-08-11 NOTE — Op Note (Signed)
[place] Patient Name: Mallory [name] Procedure Date: 08/11/2020 2:40 PM MRN: [id number] Endoscopist: Thornton Park MD, MD Age: 65 Referring MD:  Date of Birth: 01/27/1956 Gender: Female Account #: [id number] Procedure:                Upper GI endoscopy Indications:              Abdominal pain Medicines:                Monitored Anesthesia Care Procedure:                Pre-Anesthesia Assessment:                           - Prior to the procedure, a History and Physical                            was performed, and patient medications and                            allergies were reviewed. The patient's tolerance of                            previous anesthesia was also reviewed. The risks                            and benefits of the procedure and the sedation                            options and risks were discussed with the patient.                            All questions were answered, and informed consent                            was obtained. Prior Anticoagulants: The patient has                            taken no previous anticoagulant or antiplatelet                            agents. ASA Grade Assessment: II - A patient with                            mild systemic disease. After reviewing the risks                            and benefits, the patient was deemed in                            satisfactory condition to undergo the procedure.                           After obtaining informed consent, the endoscope was  passed under direct vision. Throughout the                            procedure, the patient's blood pressure, pulse, and                            oxygen saturations were monitored continuously. The                            Endoscope was introduced through the mouth, and                            advanced to the third part of duodenum. The upper                            GI endoscopy was accomplished  without difficulty.                            The patient tolerated the procedure well. Scope In: Scope Out: Findings:                 The esophagus was normal.                           Diffuse mild mucosal changes characterized by an                            increased vascular pattern were found in the                            gastric body. Biopsies were taken from the antrum,                            body, and fundus with a cold forceps for histology.                            Estimated blood loss was minimal.                           The examined duodenum was normal. Biopsies were                            taken with a cold forceps for histology. Estimated                            blood loss was minimal.                           The cardia and gastric fundus were normal on                            retroflexion.                           The exam was otherwise without abnormality. Complications:  No immediate complications. Estimated blood loss:                            Minimal. Estimated Blood Loss:     Estimated blood loss was minimal. Impression:               - Normal esophagus.                           - Increased vascular pattern mucosa in the gastric                            body. Biopsied.                           - Normal examined duodenum. Biopsied.                           - The examination was otherwise normal. Recommendation:           - Patient has a contact number available for                            emergencies. The signs and symptoms of potential                            delayed complications were discussed with the                            patient. Return to normal activities tomorrow.                            Written discharge instructions were provided to the                            patient.                           - Resume previous diet.                           - Continue present medications.                            - Await pathology results.                           - Proceed with colonoscopy as previously planned. Thornton Park MD, MD 08/11/2020 3:12:22 PM This report has been signed electronically.

## 2020-08-11 NOTE — Patient Instructions (Signed)
Handouts Provided:  Polyps, diverticulosis and Gastritis  YOU HAD AN ENDOSCOPIC PROCEDURE TODAY AT [facility]:   Refer to the procedure report that was given to you for any specific questions about what was found during the examination.  If the procedure report does not answer your questions, please call your gastroenterologist to clarify.  If you requested that your care partner not be given the details of your procedure findings, then the procedure report has been included in a sealed envelope for you to review at your convenience later.  YOU SHOULD EXPECT: Some feelings of bloating in the abdomen. Passage of more gas than usual.  Walking can help get rid of the air that was put into your GI tract during the procedure and reduce the bloating. If you had a lower endoscopy (such as a colonoscopy or flexible sigmoidoscopy) you may notice spotting of blood in your stool or on the toilet paper. If you underwent a bowel prep for your procedure, you may not have a normal bowel movement for a few days.  Please Note:  You might notice some irritation and congestion in your nose or some drainage.  This is from the oxygen used during your procedure.  There is no need for concern and it should clear up in a day or so.  SYMPTOMS TO REPORT IMMEDIATELY:   Following lower endoscopy (colonoscopy or flexible sigmoidoscopy):  Excessive amounts of blood in the stool  Significant tenderness or worsening of abdominal pains  Swelling of the abdomen that is new, acute  Fever of 100F or higher   Following upper endoscopy (EGD)  Vomiting of blood or coffee ground material  New chest pain or pain under the shoulder blades  Painful or persistently difficult swallowing  New shortness of breath  Fever of 100F or higher  Black, tarry-looking stools  For urgent or emergent issues, a gastroenterologist can be reached at any hour by calling [phone]. Do not use MyChart messaging for urgent  concerns.    DIET:  We do recommend a small meal at first, but then you may proceed to your regular diet.  Drink plenty of fluids but you should avoid alcoholic beverages for 24 hours.  ACTIVITY:  You should plan to take it easy for the rest of today and you should NOT DRIVE or use heavy machinery until tomorrow (because of the sedation medicines used during the test).    FOLLOW UP: Our staff will call the number listed on your records 48-72 hours following your procedure to check on you and address any questions or concerns that you may have regarding the information given to you following your procedure. If we do not reach you, we will leave a message.  We will attempt to reach you two times.  During this call, we will ask if you have developed any symptoms of COVID 19. If you develop any symptoms (ie: fever, flu-like symptoms, shortness of breath, cough etc.) before then, please call [phone].  If you test positive for Covid 19 in the 2 weeks post procedure, please call and report this information to [place].    If any biopsies were taken you will be contacted by phone or by letter within the next 1-3 weeks.  Please call us at [phone] if you have not heard about the biopsies in 3 weeks.    SIGNATURES/CONFIDENTIALITY: You and/or your care partner have signed paperwork which will be entered into your electronic medical record.  These signatures attest to  the fact that that the information above on your After Visit Summary has been reviewed and is understood.  Full responsibility of the confidentiality of this discharge information lies with you and/or your care-partner.

## 2020-08-11 NOTE — Progress Notes (Signed)
Report to PACU, RN, vss, BBS= Clear.  

## 2020-08-11 NOTE — Op Note (Signed)
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                           Diverticulosis without history of diverticulitis                           History of colon polyps on prior colonoscopy at                            [facility]                           - surveillance colonoscopy due last year                           Family history of colon cancer (Brother with colon                            was colon cancer at age 66) Medicines:                Monitored Anesthesia Care Procedure:                Pre-Anesthesia Assessment:                           - Prior to the procedure, a History and Physical                            was performed, and patient medications and  allergies were reviewed. The patient's tolerance of                            previous anesthesia was also reviewed. The risks                            and benefits of the procedure and the sedation                            options and risks were discussed with the patient.                            All questions were answered, and informed consent                            was obtained. Prior Anticoagulants: The patient has                            taken no previous anticoagulant or antiplatelet                            agents. ASA Grade  Assessment: II - A patient with                            mild systemic disease. After reviewing the risks                            and benefits, the patient was deemed in                            satisfactory condition to undergo the procedure.                           After obtaining informed consent, the colonoscope                            was passed under direct vision. Throughout the                            procedure, the patient's blood pressure, pulse, and                            oxygen saturations were monitored continuously. The                            Olympus PCF-H190DL (#[id number]) Colonoscope was                            introduced through the anus and advanced to the the                            cecum, identified by appendiceal orifice and  ileocecal valve. The colonoscopy was performed                            without difficulty. The patient tolerated the                            procedure well. The quality of the bowel                            preparation was good. The terminal ileum, ileocecal                            valve, appendiceal orifice, and rectum were                            photographed. Scope In: 2:54:35 PM Scope Out: 3:08:18 PM Scope Withdrawal Time: 0 hours 10 minutes 28 seconds  Total Procedure Duration: 0 hours 13 minutes 43 seconds  Findings:                 Hemorrhoids were found on perianal exam.                           Non-bleeding internal hemorrhoids were found.                           Multiple small and large-mouthed diverticula were                            found in the entire colon.                           A 2 mm polyp was found in the cecum. The polyp was                            flat. The polyp was removed with a cold snare.                            Resection and retrieval were complete. Estimated                            blood loss was minimal.                           The  exam was otherwise without abnormality on                            direct and retroflexion views. Complications:            No immediate complications. Estimated blood loss:                            Minimal. Estimated Blood Loss:     Estimated blood loss was minimal. Impression:               - Hemorrhoids found on perianal exam.                           -  Non-bleeding internal hemorrhoids. The likely                            source of rectal bleeding.                           - Diverticulosis in the entire examined colon.                           - One 2 mm polyp in the cecum, removed with a cold                            snare. Resected and retrieved.                           - The examination was otherwise normal on direct                            and retroflexion views. Recommendation:           - Patient has a contact number available for                            emergencies. The signs and symptoms of potential                            delayed complications were discussed with the                            patient. Return to normal activities tomorrow.                            Written discharge instructions were provided to the                            patient.                           - Resume previous diet. High fiber diet recommended.                           - Continue present medications.                           - Await pathology results.                           - Repeat colonoscopy in 5 years for surveillance                            given the family history.                           - Emerging evidence supports eating a diet of  fruits, vegetables, grains, calcium, and yogurt                            while reducing red meat and alcohol may reduce the                            risk of colon cancer.                           - Thank you for allowing me to be involved in your                            colon cancer  prevention. Thornton Park MD, MD 08/11/2020 3:17:33 PM This report has been signed electronically.

## 2020-08-11 NOTE — Progress Notes (Signed)
VS by CW  Pt's states no medical or surgical changes since previsit or office visit.  

## 2020-08-11 NOTE — Progress Notes (Signed)
Called to room to assist during endoscopic procedure.  Patient ID and intended procedure confirmed with present staff. Received instructions for my participation in the procedure from the performing physician.  

## 2020-08-15 ENCOUNTER — Telehealth: Payer: Self-pay

## 2020-08-15 ENCOUNTER — Telehealth: Payer: Self-pay | Admitting: *Deleted

## 2020-08-15 NOTE — Telephone Encounter (Signed)
  Follow up Call-  Call back number 08/11/2020  Post procedure Call Back phone  # [phone]  Permission to leave phone message Yes  Some recent data might be hidden     Patient questions:  Do you have a fever, pain , or abdominal swelling? No. Pain Score  0 *  Have you tolerated food without any problems? Yes.    Have you been able to return to your normal activities? Yes.    Do you have any questions about your discharge instructions: Diet   No. Medications  No. Follow up visit  No.  Do you have questions or concerns about your Care? No.  Actions: * If pain score is 4 or above: No action needed, pain <4.  1. Have you developed a fever since your procedure? no  2.   Have you had an respiratory symptoms (SOB or cough) since your procedure? no  3.   Have you tested positive for COVID 19 since your procedure no  4.   Have you had any family members/close contacts diagnosed with the COVID 19 since your procedure?  no   If yes to any of these questions please route to Joylene John, RN and Joella Prince, RN

## 2020-08-15 NOTE — Telephone Encounter (Signed)
Left message on follow up call. 

## 2020-08-28 ENCOUNTER — Encounter: Payer: Self-pay | Admitting: Gastroenterology

## 2020-11-08 ENCOUNTER — Other Ambulatory Visit: Payer: Self-pay

## 2020-11-08 ENCOUNTER — Ambulatory Visit: Payer: No Typology Code available for payment source | Admitting: Family Medicine

## 2020-11-08 ENCOUNTER — Ambulatory Visit: Payer: No Typology Code available for payment source | Admitting: Nurse Practitioner

## 2020-11-08 ENCOUNTER — Encounter: Payer: Self-pay | Admitting: Nurse Practitioner

## 2020-11-08 VITALS — BP 108/66 | HR 102 | Temp 97.2°F | Ht 61.0 in | Wt 86.0 lb

## 2020-11-08 DIAGNOSIS — I10 Essential (primary) hypertension: Secondary | ICD-10-CM | POA: Diagnosis not present

## 2020-11-08 DIAGNOSIS — J310 Chronic rhinitis: Secondary | ICD-10-CM | POA: Diagnosis not present

## 2020-11-08 DIAGNOSIS — E785 Hyperlipidemia, unspecified: Secondary | ICD-10-CM | POA: Diagnosis not present

## 2020-11-08 DIAGNOSIS — F17200 Nicotine dependence, unspecified, uncomplicated: Secondary | ICD-10-CM

## 2020-11-08 LAB — CBC WITH DIFFERENTIAL/PLATELET
Basophils Absolute: 0.1 10*3/uL (ref 0.0–0.2)
Basos: 1 %
EOS (ABSOLUTE): 0.1 10*3/uL (ref 0.0–0.4)
Eos: 2 %
Hematocrit: 38.9 % (ref 34.0–46.6)
Hemoglobin: 12.8 g/dL (ref 11.1–15.9)
Immature Grans (Abs): 0 10*3/uL (ref 0.0–0.1)
Immature Granulocytes: 0 %
Lymphocytes Absolute: 2.7 10*3/uL (ref 0.7–3.1)
Lymphs: 42 %
MCH: 30.8 pg (ref 26.6–33.0)
MCHC: 32.9 g/dL (ref 31.5–35.7)
MCV: 94 fL (ref 79–97)
Monocytes Absolute: 0.7 10*3/uL (ref 0.1–0.9)
Monocytes: 12 %
Neutrophils Absolute: 2.7 10*3/uL (ref 1.4–7.0)
Neutrophils: 43 %
Platelets: 275 10*3/uL (ref 150–450)
RBC: 4.15 x10E6/uL (ref 3.77–5.28)
RDW: 11.9 % (ref 11.7–15.4)
WBC: 6.3 10*3/uL (ref 3.4–10.8)

## 2020-11-08 LAB — COMPREHENSIVE METABOLIC PANEL
ALT: 20 IU/L (ref 0–32)
AST: 26 IU/L (ref 0–40)
Albumin/Globulin Ratio: 1.9 (ref 1.2–2.2)
Albumin: 4.8 g/dL (ref 3.8–4.8)
Alkaline Phosphatase: 82 IU/L (ref 44–121)
BUN/Creatinine Ratio: 18 (ref 12–28)
BUN: 19 mg/dL (ref 8–27)
Bilirubin Total: 0.5 mg/dL (ref 0.0–1.2)
CO2: 23 mmol/L (ref 20–29)
Calcium: 10.1 mg/dL (ref 8.7–10.3)
Chloride: 101 mmol/L (ref 96–106)
Creatinine, Ser: 1.06 mg/dL — ABNORMAL HIGH (ref 0.57–1.00)
Globulin, Total: 2.5 g/dL (ref 1.5–4.5)
Glucose: 99 mg/dL (ref 65–99)
Potassium: 4.2 mmol/L (ref 3.5–5.2)
Sodium: 139 mmol/L (ref 134–144)
Total Protein: 7.3 g/dL (ref 6.0–8.5)
eGFR: 59 mL/min/{1.73_m2} — ABNORMAL LOW (ref >59)

## 2020-11-08 LAB — LIPID PANEL
Chol/HDL Ratio: 1.9 ratio (ref 0.0–4.4)
Cholesterol, Total: 171 mg/dL (ref 100–199)
HDL: 91 mg/dL (ref >39)
LDL Chol Calc (NIH): 62 mg/dL (ref 0–99)
Triglycerides: 104 mg/dL (ref 0–149)
VLDL Cholesterol Cal: 18 mg/dL (ref 5–40)

## 2020-11-08 MED ORDER — PREDNISONE 10 MG (21) PO TBPK: ORAL_TABLET | ORAL | 0 refills | Status: DC

## 2020-11-08 NOTE — Assessment & Plan Note (Signed)
No changes to current medication, symptoms well managed. Continue low sodium diet and follow up as directed. Completed labs-CBC, CMP, Lipid panel. Education provided , printed hand out given

## 2020-11-08 NOTE — Patient Instructions (Signed)
Cholesterol Content in Foods Cholesterol is a waxy, fat-like substance that helps to carry fat in the blood. The body needs cholesterol in small amounts, but too much cholesterol can causedamage to the arteries and heart. Most people should eat less than 200 milligrams (mg) of cholesterol a day. Foods with cholesterol  Cholesterol is found in animal-based foods, such as meat, seafood, and dairy. Generally, low-fat dairy and lean meats have less cholesterol than full-fat dairy and fatty meats. The milligrams of cholesterol per serving (mg per serving) of common cholesterol-containing foods are listed below. Meat and other proteins Egg -- one large whole egg has 186 mg. Veal shank -- 4 oz has 141 mg. Lean ground turkey (93% lean) -- 4 oz has 118 mg. Fat-trimmed lamb loin -- 4 oz has 106 mg. Lean ground beef (90% lean) -- 4 oz has 100 mg. Lobster -- 3.5 oz has 90 mg. Pork loin chops -- 4 oz has 86 mg. Canned salmon -- 3.5 oz has 83 mg. Fat-trimmed beef top loin -- 4 oz has 78 mg. Frankfurter -- 1 frank (3.5 oz) has 77 mg. Crab -- 3.5 oz has 71 mg. Roasted chicken without skin, white meat -- 4 oz has 66 mg. Light bologna -- 2 oz has 45 mg. Deli-cut turkey -- 2 oz has 31 mg. Canned tuna -- 3.5 oz has 31 mg. Bacon -- 1 oz has 29 mg. Oysters and mussels (raw) -- 3.5 oz has 25 mg. Mackerel -- 1 oz has 22 mg. Trout -- 1 oz has 20 mg. Pork sausage -- 1 link (1 oz) has 17 mg. Salmon -- 1 oz has 16 mg. Tilapia -- 1 oz has 14 mg. Dairy Soft-serve ice cream --  cup (4 oz) has 103 mg. Whole-milk yogurt -- 1 cup (8 oz) has 29 mg. Cheddar cheese -- 1 oz has 28 mg. American cheese -- 1 oz has 28 mg. Whole milk -- 1 cup (8 oz) has 23 mg. 2% milk -- 1 cup (8 oz) has 18 mg. Cream cheese -- 1 tablespoon (Tbsp) has 15 mg. Cottage cheese --  cup (4 oz) has 14 mg. Low-fat (1%) milk -- 1 cup (8 oz) has 10 mg. Sour cream -- 1 Tbsp has 8.5 mg. Low-fat yogurt -- 1 cup (8 oz) has 8 mg. Nonfat Greek  yogurt -- 1 cup (8 oz) has 7 mg. Half-and-half cream -- 1 Tbsp has 5 mg. Fats and oils Cod liver oil -- 1 tablespoon (Tbsp) has 82 mg. Butter -- 1 Tbsp has 15 mg. Lard -- 1 Tbsp has 14 mg. Bacon grease -- 1 Tbsp has 14 mg. Mayonnaise -- 1 Tbsp has 5-10 mg. Margarine -- 1 Tbsp has 3-10 mg. Exact amounts of cholesterol in these foods may vary depending on specificingredients and brands. Foods without cholesterol Most plant-based foods do not have cholesterol unless you combine them with a food that has cholesterol. Foods without cholesterol include: Grains and cereals. Vegetables. Fruits. Vegetable oils, such as olive, canola, and sunflower oil. Legumes, such as peas, beans, and lentils. Nuts and seeds. Egg whites. Summary The body needs cholesterol in small amounts, but too much cholesterol can cause damage to the arteries and heart. Most people should eat less than 200 milligrams (mg) of cholesterol a day. This information is not intended to replace advice given to you by your health care provider. Make sure you discuss any questions you have with your healthcare provider. Document Revised: 06/15/2019 Document Reviewed: 07/26/2019 Elsevier Patient Education  2022   [place]. Hypertension, Adult Hypertension is another name for high blood pressure. High blood pressure forces your heart to work harder to pump blood. This can cause problems overtime. There are two numbers in a blood pressure reading. There is a top number (systolic) over a bottom number (diastolic). It is best to have a blood pressure that is below 120/80. Healthy choicescan help lower your blood pressure, or you may need medicine to help lower it. What are the causes? The cause of this condition is not known. Some conditions may be related tohigh blood pressure. What increases the risk? Smoking. Having type 2 diabetes mellitus, high cholesterol, or both. Not getting enough exercise or physical activity. Being  overweight. Having too much fat, sugar, calories, or salt (sodium) in your diet. Drinking too much alcohol. Having long-term (chronic) kidney disease. Having a family history of high blood pressure. Age. Risk increases with age. Race. You may be at higher risk if you are African American. Gender. Men are at higher risk than women before age 48. After age 9, women are at higher risk than men. Having obstructive sleep apnea. Stress. What are the signs or symptoms? High blood pressure may not cause symptoms. Very high blood pressure (hypertensive crisis) may cause: Headache. Feelings of worry or nervousness (anxiety). Shortness of breath. Nosebleed. A feeling of being sick to your stomach (nausea). Throwing up (vomiting). Changes in how you see. Very bad chest pain. Seizures. How is this treated? This condition is treated by making healthy lifestyle changes, such as: Eating healthy foods. Exercising more. Drinking less alcohol. Your health care provider may prescribe medicine if lifestyle changes are not enough to get your blood pressure under control, and if: Your top number is above 130. Your bottom number is above 80. Your personal target blood pressure may vary. Follow these instructions at home: Eating and drinking  If told, follow the DASH eating plan. To follow this plan: Fill one half of your plate at each meal with fruits and vegetables. Fill one fourth of your plate at each meal with whole grains. Whole grains include whole-wheat pasta, brown rice, and whole-grain bread. Eat or drink low-fat dairy products, such as skim milk or low-fat yogurt. Fill one fourth of your plate at each meal with low-fat (lean) proteins. Low-fat proteins include fish, chicken without skin, eggs, beans, and tofu. Avoid fatty meat, cured and processed meat, or chicken with skin. Avoid pre-made or processed food. Eat less than 1,500 mg of salt each day. Do not drink alcohol if: Your doctor  tells you not to drink. You are pregnant, may be pregnant, or are planning to become pregnant. If you drink alcohol: Limit how much you use to: 0-1 drink a day for women. 0-2 drinks a day for men. Be aware of how much alcohol is in your drink. In the [place] one drink equals one 12 oz bottle of beer (355 mL), one 5 oz glass of wine (148 mL), or one 1 oz glass of hard liquor (44 mL).  Lifestyle  Work with your doctor to stay at a healthy weight or to lose weight. Ask your doctor what the best weight is for you. Get at least 30 minutes of exercise most days of the week. This may include walking, swimming, or biking. Get at least 30 minutes of exercise that strengthens your muscles (resistance exercise) at least 3 days a week. This may include lifting weights or doing Pilates. Do not use any products that contain nicotine or tobacco,  such as cigarettes, e-cigarettes, and chewing tobacco. If you need help quitting, ask your doctor. Check your blood pressure at home as told by your doctor. Keep all follow-up visits as told by your doctor. This is important.  Medicines Take over-the-counter and prescription medicines only as told by your doctor. Follow directions carefully. Do not skip doses of blood pressure medicine. The medicine does not work as well if you skip doses. Skipping doses also puts you at risk for problems. Ask your doctor about side effects or reactions to medicines that you should watch for. Contact a doctor if you: Think you are having a reaction to the medicine you are taking. Have headaches that keep coming back (recurring). Feel dizzy. Have swelling in your ankles. Have trouble with your vision. Get help right away if you: Get a very bad headache. Start to feel mixed up (confused). Feel weak or numb. Feel faint. Have very bad pain in your: Chest. Belly (abdomen). Throw up more than once. Have trouble breathing. Summary Hypertension is another name for high blood  pressure. High blood pressure forces your heart to work harder to pump blood. For most people, a normal blood pressure is less than 120/80. Making healthy choices can help lower blood pressure. If your blood pressure does not get lower with healthy choices, you may need to take medicine. This information is not intended to replace advice given to you by your health care provider. Make sure you discuss any questions you have with your healthcare provider. Document Revised: 11/12/2017 Document Reviewed: 11/12/2017 Elsevier Patient Education  [place].

## 2020-11-08 NOTE — Progress Notes (Signed)
Established Patient Office Visit  Subjective:  Patient ID: Mallory [name], female    DOB: 09/13/1955  Age: 65 y.o. MRN: [id number]  CC:  Chief Complaint  Patient presents with   Medical Management of Chronic Issues    HPI JERALD [name] presents for follow up of hypertension. Patient was diagnosed in 2016 The patient is tolerating the medication well without side effects. Compliance with treatment has been good; including taking medication as directed , maintains a healthy diet and regular exercise regimen , and following up as directed.   Mixed hyperlipidemia  Pt presents with hyperlipidemia. Patient was diagnosed in 2016. Compliance with treatment has been good The patient is compliant with medications, maintains a low cholesterol diet , follows up as directed , and maintains an exercise regimen . The patient denies experiencing any hypercholesterolemia related symptoms.    Allergic Rhinitis: Mallory [name] is here for evaluation of possible allergic rhinitis. Patient's symptoms include clear rhinorrhea and nasal congestion. These symptoms are seasonal. Current triggers include exposure to no known precipitant. The patient has been suffering from these symptoms for approximately 1 week. The patient has tried over the counter medications with fair relief of symptoms. Immunotherapy has never been tried. The patient has never had nasal polyps. The patient has a history of asthma. The patient does not suffer from frequent sinopulmonary infections. The patient has not had sinus surgery in the past. The patient has no history of eczema.   Past Medical History:  Diagnosis Date   Asthma    Depression    Hyperlipidemia    Hypertension    Rheumatoid arthritis ([place])     Past Surgical History:  Procedure Laterality Date   ABDOMINAL HYSTERECTOMY     FRACTURE SURGERY Right    Right Arm   MOUTH SURGERY      Family History  Problem Relation Age of Onset   Cancer Sister         lymphoma   Cancer Brother        colon   Stomach cancer Maternal Aunt    Esophageal cancer Neg Hx    Pancreatic cancer Neg Hx    Liver cancer Neg Hx    Liver disease Neg Hx     Social History   Socioeconomic History   Marital status: Married    Spouse name: Not on file   Number of children: Not on file   Years of education: Not on file   Highest education level: Not on file  Occupational History   Occupation: Engineer, building services  Tobacco Use   Smoking status: Every Day    Packs/day: 0.25    Years: 40.00    Pack years: 10.00    Types: Cigarettes   Smokeless tobacco: Never  Vaping Use   Vaping Use: Never used  Substance and Sexual Activity   Alcohol use: Yes    Comment: occasional   Drug use: No   Sexual activity: Yes    Birth control/protection: Post-menopausal, Surgical  Other Topics Concern   Not on file  Social History Narrative   Not on file   Social Determinants of Health   Financial Resource Strain: Not on file  Food Insecurity: Not on file  Transportation Needs: Not on file  Physical Activity: Not on file  Stress: Not on file  Social Connections: Not on file  Intimate Partner Violence: Not on file    Outpatient Medications Prior to Visit  Medication Sig Dispense Refill   albuterol (PROAIR  HFA) 108 (90 Base) MCG/ACT inhaler Inhale 2 puffs into the lungs as needed. 18 g 11   amLODipine (NORVASC) 5 MG tablet TAKE 1 TABLET BY MOUTH EVERY DAY FOR BLOOD PRESSURE 90 tablet 1   fluticasone furoate-vilanterol (BREO ELLIPTA) 100-25 MCG/INH AEPB Inhale 1 puff into the lungs daily. (Patient taking differently: Inhale 1 puff into the lungs daily as needed.) 60 each 11   golimumab (SIMPONI ARIA) 50 MG/4ML SOLN injection Inject into the vein every 8 (eight) weeks.     hydroxychloroquine (PLAQUENIL) 200 MG tablet Take 200 mg by mouth daily.     leflunomide (ARAVA) 10 MG tablet Take 10 mg by mouth daily.     olmesartan-hydrochlorothiazide (BENICAR HCT) 20-12.5 MG tablet TAKE  1 TABLET EVERY DAY IN THE MORNING FOR BLOOD PRESSURE 90 tablet 1   rosuvastatin (CRESTOR) 10 MG tablet Take 1 tablet (10 mg total) by mouth daily. For cholesterol 90 tablet 1   triamcinolone cream (KENALOG) 0.1 % Apply 1 application topically 2 (two) times daily. 80 g 0   Facility-Administered Medications Prior to Visit  Medication Dose Route Frequency Provider Last Rate Last Admin   0.9 %  sodium chloride infusion  500 mL Intravenous Once Thornton Park, MD        Allergies  Allergen Reactions   Aspirin [Aspirin] Palpitations    ROS Review of Systems  Constitutional: Negative.   HENT: Negative.    Respiratory: Negative.    Cardiovascular: Negative.   Gastrointestinal: Negative.   Endocrine: Negative.   Musculoskeletal: Negative.   Skin: Negative.  Negative for rash.  Neurological: Negative.   Psychiatric/Behavioral: Negative.    All other systems reviewed and are negative.    Objective:    Physical Exam Vitals and nursing note reviewed.  Constitutional:      Appearance: Normal appearance.  HENT:     Head: Normocephalic.     Nose: Nose normal.  Eyes:     Conjunctiva/sclera: Conjunctivae normal.  Cardiovascular:     Rate and Rhythm: Normal rate and regular rhythm.  Pulmonary:     Effort: Pulmonary effort is normal.     Breath sounds: Normal breath sounds.  Abdominal:     General: Bowel sounds are normal.  Skin:    General: Skin is warm.     Findings: No rash.  Neurological:     Mental Status: She is oriented to person, place, and time.  Psychiatric:        Behavior: Behavior normal.    BP 108/66   Pulse (!) 102   Temp (!) 97.2 F (36.2 C) (Temporal)   Ht '5\' 1"'$  (1.549 m)   Wt 86 lb (39 kg)   SpO2 98%   BMI 16.25 kg/m  Wt Readings from Last 3 Encounters:  11/08/20 86 lb (39 kg)  08/11/20 89 lb (40.4 kg)  06/26/20 88 lb 3.2 oz (40 kg)     Health Maintenance Due  Topic Date Due   Pneumococcal Vaccine 86-8 Years old (1 - PCV) Never done   HIV  Screening  Never done   PAP SMEAR-Modifier  04/01/2002   Zoster Vaccines- Shingrix (1 of 2) Never done   COVID-19 Vaccine (4 - Booster for Pfizer series) 05/03/2020   INFLUENZA VACCINE  10/16/2020    There are no preventive care reminders to display for this patient.  Lab Results  Component Value Date   TSH 3.040 09/17/2016   Lab Results  Component Value Date   WBC 6.4 05/11/2020  HGB 13.6 05/11/2020   HCT 40.4 05/11/2020   MCV 94 05/11/2020   PLT 304 05/11/2020   Lab Results  Component Value Date   NA 138 05/11/2020   K 4.3 05/11/2020   CO2 20 05/11/2020   GLUCOSE 97 05/11/2020   BUN 23 05/11/2020   CREATININE 0.95 05/11/2020   BILITOT 0.4 05/11/2020   ALKPHOS 84 05/11/2020   AST 25 05/11/2020   ALT 16 05/11/2020   PROT 7.3 05/11/2020   ALBUMIN 4.6 05/11/2020   CALCIUM 10.0 05/11/2020   ANIONGAP 11 12/04/2018   Lab Results  Component Value Date   CHOL 238 (H) 05/11/2020   Lab Results  Component Value Date   HDL 67 05/11/2020   Lab Results  Component Value Date   LDLCALC 140 (H) 05/11/2020   Lab Results  Component Value Date   TRIG 178 (H) 05/11/2020   Lab Results  Component Value Date   CHOLHDL 3.6 05/11/2020   No results found for: HGBA1C    Assessment & Plan:   Problem List Items Addressed This Visit       Cardiovascular and Mediastinum   Essential hypertension - Primary    No changes to current medication, symptoms well managed. Continue low sodium diet and follow up as directed. Completed labs-CBC, CMP, Lipid panel. Education provided , printed hand out given      Relevant Orders   CBC with Differential   Comprehensive metabolic panel     Respiratory   Chronic rhinitis    Continue Breo Elipta, and complete prednisone Pak as prescribed for new exacerbation. Follow up with worsening unresolved symptoms.  Rx sent to pharmacy, education provided with printed hand out give.      Relevant Medications   predniSONE (STERAPRED UNI-PAK 21  TAB) 10 MG (21) TBPK tablet     Other   Hyperlipidemia    Completed lipid panel, results pending, no new symptoms of hyper or hypo lipidemia      Relevant Orders   Lipid Panel   Smoker    Patient is not ready to quit but reports decrease in amount of daily cigarette.       Meds ordered this encounter  Medications   predniSONE (STERAPRED UNI-PAK 21 TAB) 10 MG (21) TBPK tablet    Sig: 6 tablet day 1, 5 tablet day 2, 4 tablet day 3, 3 tablet day 4, 2 tablet day 5 , 1 tablet day 6    Dispense:  1 each    Refill:  0    Order Specific Question:   Supervising Provider    Answer:   Janora Norlander [id number]    Follow-up: Return in about 6 months (around 05/11/2021).    Ivy Lynn, NP

## 2020-11-08 NOTE — Assessment & Plan Note (Signed)
Patient is not ready to quit but reports decrease in amount of daily cigarette.

## 2020-11-08 NOTE — Assessment & Plan Note (Signed)
Continue Breo Elipta, and complete prednisone Pak as prescribed for new exacerbation. Follow up with worsening unresolved symptoms.  Rx sent to pharmacy, education provided with printed hand out give.

## 2020-11-08 NOTE — Assessment & Plan Note (Signed)
Completed lipid panel, results pending, no new symptoms of hyper or hypo lipidemia

## 2020-11-11 ENCOUNTER — Other Ambulatory Visit: Payer: Self-pay | Admitting: Family Medicine

## 2020-12-07 ENCOUNTER — Ambulatory Visit: Payer: No Typology Code available for payment source | Admitting: Physician Assistant

## 2020-12-09 ENCOUNTER — Other Ambulatory Visit: Payer: Self-pay | Admitting: Family Medicine

## 2020-12-09 DIAGNOSIS — J439 Emphysema, unspecified: Secondary | ICD-10-CM

## 2020-12-21 ENCOUNTER — Other Ambulatory Visit: Payer: Self-pay | Admitting: Family Medicine

## 2020-12-21 DIAGNOSIS — I10 Essential (primary) hypertension: Secondary | ICD-10-CM

## 2021-02-18 ENCOUNTER — Other Ambulatory Visit: Payer: Self-pay | Admitting: Family Medicine

## 2021-02-18 DIAGNOSIS — I10 Essential (primary) hypertension: Secondary | ICD-10-CM

## 2021-03-24 ENCOUNTER — Other Ambulatory Visit: Payer: Self-pay | Admitting: Family Medicine

## 2021-03-24 DIAGNOSIS — I10 Essential (primary) hypertension: Secondary | ICD-10-CM

## 2021-04-06 ENCOUNTER — Other Ambulatory Visit: Payer: Self-pay | Admitting: Family Medicine

## 2021-04-06 DIAGNOSIS — R21 Rash and other nonspecific skin eruption: Secondary | ICD-10-CM

## 2021-05-10 ENCOUNTER — Ambulatory Visit: Payer: No Typology Code available for payment source | Admitting: Family Medicine

## 2021-05-10 ENCOUNTER — Encounter: Payer: Self-pay | Admitting: Family Medicine

## 2021-05-10 VITALS — BP 102/67 | HR 104 | Temp 97.3°F | Ht 61.0 in | Wt 88.0 lb

## 2021-05-10 DIAGNOSIS — I10 Essential (primary) hypertension: Secondary | ICD-10-CM | POA: Diagnosis not present

## 2021-05-10 DIAGNOSIS — L509 Urticaria, unspecified: Secondary | ICD-10-CM | POA: Diagnosis not present

## 2021-05-10 DIAGNOSIS — E785 Hyperlipidemia, unspecified: Secondary | ICD-10-CM

## 2021-05-10 LAB — LIPID PANEL

## 2021-05-10 MED ORDER — PREDNISONE 10 MG PO TABS: ORAL_TABLET | ORAL | 0 refills | Status: DC

## 2021-05-10 MED ORDER — ROSUVASTATIN CALCIUM 10 MG PO TABS: ORAL_TABLET | ORAL | 3 refills | Status: DC

## 2021-05-10 MED ORDER — OLMESARTAN MEDOXOMIL-HCTZ 20-12.5 MG PO TABS: ORAL_TABLET | ORAL | 3 refills | Status: DC

## 2021-05-10 MED ORDER — FLUTICASONE FUROATE-VILANTEROL 200-25 MCG/ACT IN AEPB: 1.0000 | INHALATION_SPRAY | Freq: Every day | RESPIRATORY_TRACT | 11 refills | Status: DC

## 2021-05-10 MED ORDER — AMLODIPINE BESYLATE 5 MG PO TABS: ORAL_TABLET | ORAL | 2 refills | Status: DC

## 2021-05-10 NOTE — Progress Notes (Signed)
Subjective:  Patient ID: Mallory [name], female    DOB: 1956-03-05  Age: 66 y.o. MRN: [id number]  CC: Medical Management of Chronic Issues   HPI Mallory [name] presents for  follow-up of hypertension. Patient has no history of headache chest pain or shortness of breath or recent cough. Patient also denies symptoms of TIA such as focal numbness or weakness. Patient denies side effects from medication. States taking it regularly.  Skin issues of two months. Itching. Comes and goes. Saw Dr. Anabel Bene last June. Triamcinolone helped then but not this time.  History Mallory [name] has a past medical history of Asthma, Depression, Hyperlipidemia, Hypertension, and Rheumatoid arthritis ([place]).   She has a past surgical history that includes Abdominal hysterectomy; Fracture surgery (Right); and Mouth surgery.   Her family history includes Cancer in her brother and sister; Stomach cancer in her maternal aunt.She reports that she has been smoking cigarettes. She has a 10.00 pack-year smoking history. She has never used smokeless tobacco. She reports current alcohol use. She reports that she does not use drugs.  Current Outpatient Medications on File Prior to Visit  Medication Sig Dispense Refill   albuterol (PROAIR HFA) 108 (90 Base) MCG/ACT inhaler Inhale 2 puffs into the lungs as needed. 18 g 11   golimumab (SIMPONI ARIA) 50 MG/4ML SOLN injection Inject into the vein every 8 (eight) weeks.     hydroxychloroquine (PLAQUENIL) 200 MG tablet Take 200 mg by mouth daily.     leflunomide (ARAVA) 10 MG tablet Take 10 mg by mouth daily.     triamcinolone cream (KENALOG) 0.1 % APPLY TO AFFECTED AREA TWICE A DAY 60 g 5   Current Facility-Administered Medications on File Prior to Visit  Medication Dose Route Frequency Provider Last Rate Last Admin   0.9 %  sodium chloride infusion  500 mL Intravenous Once Thornton Park, MD        ROS Review of Systems  Constitutional: Negative.   HENT:  Negative for  congestion.   Eyes:  Negative for visual disturbance.  Respiratory:  Negative for shortness of breath.   Cardiovascular:  Negative for chest pain.  Gastrointestinal:  Negative for abdominal pain, constipation, diarrhea, nausea and vomiting.  Genitourinary:  Negative for difficulty urinating.  Musculoskeletal:  Negative for arthralgias and myalgias.  Skin:  Negative for rash.  Neurological:  Negative for headaches.  Psychiatric/Behavioral:  Negative for sleep disturbance.    Objective:  BP 102/67    Pulse (!) 104    Temp (!) 97.3 F (36.3 C)    Ht '5\' 1"'  (1.549 m)    Wt 88 lb (39.9 kg)    SpO2 97%    BMI 16.63 kg/m   BP Readings from Last 3 Encounters:  05/10/21 102/67  11/08/20 108/66  08/11/20 107/71    Wt Readings from Last 3 Encounters:  05/10/21 88 lb (39.9 kg)  11/08/20 86 lb (39 kg)  08/11/20 89 lb (40.4 kg)     Physical Exam Constitutional:      General: She is not in acute distress.    Appearance: She is well-developed.  Cardiovascular:     Rate and Rhythm: Normal rate and regular rhythm.  Pulmonary:     Breath sounds: Normal breath sounds.  Musculoskeletal:        General: Normal range of motion.  Skin:    General: Skin is warm and dry.  Neurological:     Mental Status: She is alert and oriented to person, place, and time.  Assessment & Plan:   Mallory [name] was seen today for medical management of chronic issues.  Diagnoses and all orders for this visit:  Essential hypertension -     CBC with Differential/Platelet -     CMP14+EGFR -     amLODipine (NORVASC) 5 MG tablet; TAKE 1 TABLET BY MOUTH EVERY DAY FOR BLOOD PRESSURE -     olmesartan-hydrochlorothiazide (BENICAR HCT) 20-12.5 MG tablet; TAKE 1 TABLET EVERY DAY IN THE MORNING FOR BLOOD PRESSURE  Hyperlipidemia, unspecified hyperlipidemia type -     Lipid panel  Urticaria -     Ambulatory referral to Dermatology -     Perennial allergen profile IgE  Other orders -     rosuvastatin (CRESTOR)  10 MG tablet; TAKE 1 TABLET BY MOUTH DAILY. FOR CHOLESTEROL -     predniSONE (DELTASONE) 10 MG tablet; Take 5 daily for 3 days followed by 4,3,2 and 1 for 3 days each. -     fluticasone furoate-vilanterol (BREO ELLIPTA) 200-25 MCG/ACT AEPB; Inhale 1 puff into the lungs daily.   Allergies as of 05/10/2021       Reactions   Methotrexate    Other reaction(s): mouth ulcers   Aspirin [aspirin] Palpitations        Medication List        Accurate as of May 10, 2021 10:06 AM. If you have any questions, ask your nurse or doctor.          STOP taking these medications    Breo Ellipta 100-25 MCG/ACT Aepb Generic drug: fluticasone furoate-vilanterol Replaced by: fluticasone furoate-vilanterol 200-25 MCG/ACT Aepb Stopped by: Claretta Fraise, MD   predniSONE 10 MG (21) Tbpk tablet Commonly known as: STERAPRED UNI-PAK 21 TAB Replaced by: predniSONE 10 MG tablet Stopped by: Claretta Fraise, MD       TAKE these medications    albuterol 108 (90 Base) MCG/ACT inhaler Commonly known as: ProAir HFA Inhale 2 puffs into the lungs as needed.   amLODipine 5 MG tablet Commonly known as: NORVASC TAKE 1 TABLET BY MOUTH EVERY DAY FOR BLOOD PRESSURE   fluticasone furoate-vilanterol 200-25 MCG/ACT Aepb Commonly known as: Breo Ellipta Inhale 1 puff into the lungs daily. Replaces: Breo Ellipta 100-25 MCG/ACT Aepb Started by: Claretta Fraise, MD   hydroxychloroquine 200 MG tablet Commonly known as: PLAQUENIL Take 200 mg by mouth daily.   leflunomide 10 MG tablet Commonly known as: ARAVA Take 10 mg by mouth daily.   olmesartan-hydrochlorothiazide 20-12.5 MG tablet Commonly known as: BENICAR HCT TAKE 1 TABLET EVERY DAY IN THE MORNING FOR BLOOD PRESSURE   predniSONE 10 MG tablet Commonly known as: DELTASONE Take 5 daily for 3 days followed by 4,3,2 and 1 for 3 days each. Replaces: predniSONE 10 MG (21) Tbpk tablet Started by: Claretta Fraise, MD   rosuvastatin 10 MG tablet Commonly  known as: CRESTOR TAKE 1 TABLET BY MOUTH DAILY. FOR CHOLESTEROL   Simponi Aria 50 MG/4ML Soln injection Generic drug: golimumab Inject into the vein every 8 (eight) weeks.   triamcinolone cream 0.1 % Commonly known as: KENALOG APPLY TO AFFECTED AREA TWICE A DAY        Meds ordered this encounter  Medications   amLODipine (NORVASC) 5 MG tablet    Sig: TAKE 1 TABLET BY MOUTH EVERY DAY FOR BLOOD PRESSURE    Dispense:  90 tablet    Refill:  2   olmesartan-hydrochlorothiazide (BENICAR HCT) 20-12.5 MG tablet    Sig: TAKE 1 TABLET EVERY DAY IN THE MORNING FOR  BLOOD PRESSURE    Dispense:  90 tablet    Refill:  3   rosuvastatin (CRESTOR) 10 MG tablet    Sig: TAKE 1 TABLET BY MOUTH DAILY. FOR CHOLESTEROL    Dispense:  90 tablet    Refill:  3   predniSONE (DELTASONE) 10 MG tablet    Sig: Take 5 daily for 3 days followed by 4,3,2 and 1 for 3 days each.    Dispense:  45 tablet    Refill:  0   fluticasone furoate-vilanterol (BREO ELLIPTA) 200-25 MCG/ACT AEPB    Sig: Inhale 1 puff into the lungs daily.    Dispense:  1 each    Refill:  11      Follow-up: Return in about 6 months (around 11/07/2021), or if symptoms worsen or fail to improve.  Claretta Fraise, M.D.

## 2021-05-15 LAB — CBC WITH DIFFERENTIAL/PLATELET
Basophils Absolute: 0.1 10*3/uL (ref 0.0–0.2)
Basos: 1 %
EOS (ABSOLUTE): 0.3 10*3/uL (ref 0.0–0.4)
Eos: 5 %
Hematocrit: 41.2 % (ref 34.0–46.6)
Hemoglobin: 13.5 g/dL (ref 11.1–15.9)
Immature Grans (Abs): 0 10*3/uL (ref 0.0–0.1)
Immature Granulocytes: 0 %
Lymphocytes Absolute: 2.4 10*3/uL (ref 0.7–3.1)
Lymphs: 40 %
MCH: 30.6 pg (ref 26.6–33.0)
MCHC: 32.8 g/dL (ref 31.5–35.7)
MCV: 93 fL (ref 79–97)
Monocytes Absolute: 0.7 10*3/uL (ref 0.1–0.9)
Monocytes: 13 %
Neutrophils Absolute: 2.3 10*3/uL (ref 1.4–7.0)
Neutrophils: 41 %
Platelets: 284 10*3/uL (ref 150–450)
RBC: 4.41 x10E6/uL (ref 3.77–5.28)
RDW: 12.6 % (ref 11.7–15.4)
WBC: 5.7 10*3/uL (ref 3.4–10.8)

## 2021-05-15 LAB — CMP14+EGFR
ALT: 24 IU/L (ref 0–32)
AST: 31 IU/L (ref 0–40)
Albumin/Globulin Ratio: 1.5 (ref 1.2–2.2)
Albumin: 4.7 g/dL (ref 3.8–4.8)
Alkaline Phosphatase: 87 IU/L (ref 44–121)
BUN/Creatinine Ratio: 22 (ref 12–28)
BUN: 26 mg/dL (ref 8–27)
Bilirubin Total: 0.3 mg/dL (ref 0.0–1.2)
CO2: 21 mmol/L (ref 20–29)
Calcium: 10.4 mg/dL — ABNORMAL HIGH (ref 8.7–10.3)
Chloride: 100 mmol/L (ref 96–106)
Creatinine, Ser: 1.18 mg/dL — ABNORMAL HIGH (ref 0.57–1.00)
Globulin, Total: 3.1 g/dL (ref 1.5–4.5)
Glucose: 94 mg/dL (ref 70–99)
Potassium: 4.1 mmol/L (ref 3.5–5.2)
Sodium: 137 mmol/L (ref 134–144)
Total Protein: 7.8 g/dL (ref 6.0–8.5)
eGFR: 52 mL/min/{1.73_m2} — ABNORMAL LOW (ref >59)

## 2021-05-15 LAB — ALLERGEN PROFILE, PERENNIAL ALLERGEN IGE
Alternaria Alternata IgE: <0.1 kU/L
Aspergillus Fumigatus IgE: <0.1 kU/L
Aureobasidi Pullulans IgE: <0.1 kU/L
Candida Albicans IgE: <0.1 kU/L
Cat Dander IgE: 2.95 kU/L — AB
Chicken Feathers IgE: <0.1 kU/L
Cladosporium Herbarum IgE: <0.1 kU/L
Cow Dander IgE: <0.1 kU/L
D Farinae IgE: 0.14 kU/L — AB
D Pteronyssinus IgE: 0.15 kU/L — AB
Dog Dander IgE: 0.66 kU/L — AB
Duck Feathers IgE: <0.1 kU/L
Goose Feathers IgE: <0.1 kU/L
Mouse Urine IgE: <0.1 kU/L
Mucor Racemosus IgE: <0.1 kU/L
Penicillium Chrysogen IgE: <0.1 kU/L
Phoma Betae IgE: <0.1 kU/L
Setomelanomma Rostrat: <0.1 kU/L
Stemphylium Herbarum IgE: <0.1 kU/L

## 2021-05-15 LAB — LIPID PANEL
Chol/HDL Ratio: 2.5 ratio (ref 0.0–4.4)
Cholesterol, Total: 220 mg/dL — ABNORMAL HIGH (ref 100–199)
HDL: 89 mg/dL (ref >39)
LDL Chol Calc (NIH): 117 mg/dL — ABNORMAL HIGH (ref 0–99)
Triglycerides: 78 mg/dL (ref 0–149)
VLDL Cholesterol Cal: 14 mg/dL (ref 5–40)

## 2021-05-16 ENCOUNTER — Telehealth: Payer: Self-pay | Admitting: Dermatology

## 2021-05-16 NOTE — Telephone Encounter (Signed)
Told her no new patient openings until Sept/Oct, and she will ask [facility] to try to get her in elsewhere sooner ?

## 2021-05-16 NOTE — Telephone Encounter (Signed)
Notes documented and referral routed back to referring office. 

## 2021-06-11 ENCOUNTER — Other Ambulatory Visit: Payer: Self-pay | Admitting: Family Medicine

## 2021-06-11 DIAGNOSIS — Z1231 Encounter for screening mammogram for malignant neoplasm of breast: Secondary | ICD-10-CM

## 2021-07-13 ENCOUNTER — Ambulatory Visit: Payer: No Typology Code available for payment source

## 2021-07-13 ENCOUNTER — Ambulatory Visit
Admission: RE | Admit: 2021-07-13 | Discharge: 2021-07-13 | Disposition: A | Discharge status: Home / Self Care | Payer: No Typology Code available for payment source | Source: Ambulatory Visit | Attending: Family Medicine | Admitting: Family Medicine

## 2021-07-13 DIAGNOSIS — Z1231 Encounter for screening mammogram for malignant neoplasm of breast: Secondary | ICD-10-CM

## 2021-09-10 ENCOUNTER — Encounter: Payer: Self-pay | Admitting: Nurse Practitioner

## 2021-09-10 ENCOUNTER — Ambulatory Visit: Payer: No Typology Code available for payment source | Admitting: Nurse Practitioner

## 2021-09-10 VITALS — BP 97/69 | HR 94 | Temp 98.8°F | Ht 61.0 in | Wt 90.0 lb

## 2021-09-10 DIAGNOSIS — M7022 Olecranon bursitis, left elbow: Secondary | ICD-10-CM | POA: Diagnosis not present

## 2021-09-10 MED ORDER — PREDNISONE 20 MG PO TABS: 20.0000 mg | ORAL_TABLET | Freq: Every day | ORAL | 0 refills | Status: DC

## 2021-09-24 ENCOUNTER — Other Ambulatory Visit: Payer: Self-pay | Admitting: Nurse Practitioner

## 2021-09-24 ENCOUNTER — Telehealth: Payer: Self-pay | Admitting: Nurse Practitioner

## 2021-09-24 DIAGNOSIS — M7022 Olecranon bursitis, left elbow: Secondary | ICD-10-CM

## 2021-09-24 MED ORDER — CEPHALEXIN 500 MG PO CAPS: 500.0000 mg | ORAL_CAPSULE | Freq: Two times a day (BID) | ORAL | 0 refills | Status: DC

## 2021-09-24 NOTE — Telephone Encounter (Signed)
Pt called stating that she had an appt with [facility] on 6/26 regarding her elbow retaining fluid.  Says her elbow is not better. Wants to know if provider can send in a Rx to help her?

## 2021-09-24 NOTE — Telephone Encounter (Signed)
Patient antibiotics to pharmacy to treat her olecranon bursitis.

## 2021-09-25 NOTE — Telephone Encounter (Signed)
Left message on pts home number informing of ATB being sent to pharmacy.

## 2021-11-07 ENCOUNTER — Encounter: Payer: Self-pay | Admitting: Family Medicine

## 2021-11-07 ENCOUNTER — Ambulatory Visit: Payer: No Typology Code available for payment source | Admitting: Family Medicine

## 2021-11-07 VITALS — BP 124/77 | HR 109 | Temp 97.4°F | Ht 61.0 in | Wt 89.2 lb

## 2021-11-07 DIAGNOSIS — E785 Hyperlipidemia, unspecified: Secondary | ICD-10-CM

## 2021-11-07 DIAGNOSIS — I10 Essential (primary) hypertension: Secondary | ICD-10-CM | POA: Diagnosis not present

## 2021-11-07 DIAGNOSIS — J441 Chronic obstructive pulmonary disease with (acute) exacerbation: Secondary | ICD-10-CM | POA: Diagnosis not present

## 2021-11-07 MED ORDER — BETAMETHASONE SOD PHOS & ACET 6 (3-3) MG/ML IJ SUSP
6.0000 mg | Freq: Once | INTRAMUSCULAR | Status: AC
  Administered 2021-11-07: 6 mg via INTRAMUSCULAR

## 2021-11-07 NOTE — Progress Notes (Signed)
Subjective:  Patient ID: Mallory [name], female    DOB: 04-06-1955 Off breo, didn't have money tofill  Age: 66 y.o. MRN: [id number]  CC: Medical Management of Chronic Issues and Shortness of Breath   HPI Mallory [name] presents for rattle in chest & dyspneic with exertion for 1 week. Chest feels tight. Denies fever. Cough nonproductive. Much worse than COPD baseline. Didn't get breo this time due to cost. Had a cyst removed and spent her money on the copay.     Rattle in her chest follow-up of hypertension. Patient has no history of headache chest pain or shortness of breath or recent cough. Patient also denies symptoms of TIA such as focal numbness or weakness. Patient denies side effects from medication. States taking it regularly.   History Mallory [name] has a past medical history of Asthma, Depression, Hyperlipidemia, Hypertension, and Rheumatoid arthritis ([place]).   She has a past surgical history that includes Abdominal hysterectomy; Fracture surgery (Right); and Mouth surgery.   Her family history includes Cancer in her brother and sister; Stomach cancer in her maternal aunt.She reports that she has been smoking cigarettes. She has a 10.00 pack-year smoking history. She has never used smokeless tobacco. She reports current alcohol use. She reports that she does not use drugs.  Current Outpatient Medications on File Prior to Visit  Medication Sig Dispense Refill   albuterol (PROAIR HFA) 108 (90 Base) MCG/ACT inhaler Inhale 2 puffs into the lungs as needed. 18 g 11   amLODipine (NORVASC) 5 MG tablet TAKE 1 TABLET BY MOUTH EVERY DAY FOR BLOOD PRESSURE 90 tablet 2   doxepin (SINEQUAN) 25 MG capsule Take by mouth.     fluticasone furoate-vilanterol (BREO ELLIPTA) 200-25 MCG/ACT AEPB Inhale 1 puff into the lungs daily. 1 each 11   golimumab (SIMPONI ARIA) 50 MG/4ML SOLN injection Inject into the vein every 8 (eight) weeks.     hydroxychloroquine (PLAQUENIL) 200 MG tablet Take 200 mg by  mouth daily.     leflunomide (ARAVA) 10 MG tablet Take 10 mg by mouth daily.     olmesartan-hydrochlorothiazide (BENICAR HCT) 20-12.5 MG tablet TAKE 1 TABLET EVERY DAY IN THE MORNING FOR BLOOD PRESSURE 90 tablet 3   rosuvastatin (CRESTOR) 10 MG tablet TAKE 1 TABLET BY MOUTH DAILY. FOR CHOLESTEROL 90 tablet 3   triamcinolone cream (KENALOG) 0.1 % APPLY TO AFFECTED AREA TWICE A DAY 60 g 5   Current Facility-Administered Medications on File Prior to Visit  Medication Dose Route Frequency Provider Last Rate Last Admin   0.9 %  sodium chloride infusion  500 mL Intravenous Once [place], MD        ROS Review of Systems  Constitutional: Negative.   HENT: Negative.    Eyes:  Negative for visual disturbance.  Respiratory:  Positive for cough, shortness of breath and wheezing.   Cardiovascular:  Negative for chest pain.  Gastrointestinal:  Negative for abdominal pain.  Musculoskeletal:  Negative for arthralgias.    Objective:  BP 124/77   Pulse (!) 109   Temp (!) 97.4 F (36.3 C)   Ht '5\' 1"'  (1.549 m)   Wt 89 lb 3.2 oz (40.5 kg)   SpO2 94%   BMI 16.85 kg/m   BP Readings from Last 3 Encounters:  11/07/21 124/77  09/10/21 97/69  05/10/21 102/67    Wt Readings from Last 3 Encounters:  11/07/21 89 lb 3.2 oz (40.5 kg)  09/10/21 90 lb (40.8 kg)  05/10/21 88 lb (39.9 kg)  Physical Exam Constitutional:      General: She is in acute distress.     Appearance: She is well-developed.  Cardiovascular:     Rate and Rhythm: Normal rate and regular rhythm.  Pulmonary:     Breath sounds: Wheezing and rhonchi present.  Musculoskeletal:        General: Normal range of motion.  Skin:    General: Skin is warm and dry.  Neurological:     Mental Status: She is alert and oriented to person, place, and time.  Psychiatric:        Mood and Affect: Mood normal.       Assessment & Plan:   Mallory [name] was seen today for medical management of chronic issues and shortness of  breath.  Diagnoses and all orders for this visit:  Essential hypertension -     CBC with Differential/Platelet -     CMP14+EGFR  Hyperlipidemia, unspecified hyperlipidemia type -     Lipid panel  Acute exacerbation of chronic obstructive pulmonary disease (COPD) (HCC) -     betamethasone acetate-betamethasone sodium phosphate (CELESTONE) injection 6 mg   Allergies as of 11/07/2021       Reactions   Methotrexate    Other reaction(s): mouth ulcers   Aspirin [aspirin] Palpitations        Medication List        Accurate as of November 07, 2021  9:08 PM. If you have any questions, ask your nurse or doctor.          STOP taking these medications    cephALEXin 500 MG capsule Commonly known as: KEFLEX Stopped by: Claretta Fraise, MD   predniSONE 20 MG tablet Commonly known as: DELTASONE Stopped by: Claretta Fraise, MD       TAKE these medications    albuterol 108 (90 Base) MCG/ACT inhaler Commonly known as: ProAir HFA Inhale 2 puffs into the lungs as needed.   amLODipine 5 MG tablet Commonly known as: NORVASC TAKE 1 TABLET BY MOUTH EVERY DAY FOR BLOOD PRESSURE   doxepin 25 MG capsule Commonly known as: SINEQUAN Take by mouth.   fluticasone furoate-vilanterol 200-25 MCG/ACT Aepb Commonly known as: Breo Ellipta Inhale 1 puff into the lungs daily.   hydroxychloroquine 200 MG tablet Commonly known as: PLAQUENIL Take 200 mg by mouth daily.   leflunomide 10 MG tablet Commonly known as: ARAVA Take 10 mg by mouth daily.   olmesartan-hydrochlorothiazide 20-12.5 MG tablet Commonly known as: BENICAR HCT TAKE 1 TABLET EVERY DAY IN THE MORNING FOR BLOOD PRESSURE   rosuvastatin 10 MG tablet Commonly known as: CRESTOR TAKE 1 TABLET BY MOUTH DAILY. FOR CHOLESTEROL   Simponi Aria 50 MG/4ML Soln injection Generic drug: golimumab Inject into the vein every 8 (eight) weeks.   triamcinolone cream 0.1 % Commonly known as: KENALOG APPLY TO AFFECTED AREA TWICE A DAY         Meds ordered this encounter  Medications   betamethasone acetate-betamethasone sodium phosphate (CELESTONE) injection 6 mg    Sample of bevespri given for use until pt. Can get Breo  Follow-up: Return in about 6 months (around 05/10/2022), or if symptoms worsen or fail to improve.  Claretta Fraise, M.D.

## 2021-11-08 LAB — CBC WITH DIFFERENTIAL/PLATELET
Basophils Absolute: 0.1 10*3/uL (ref 0.0–0.2)
Basos: 1 %
EOS (ABSOLUTE): 0.8 10*3/uL — ABNORMAL HIGH (ref 0.0–0.4)
Eos: 11 %
Hematocrit: 37.9 % (ref 34.0–46.6)
Hemoglobin: 12.6 g/dL (ref 11.1–15.9)
Immature Grans (Abs): 0 10*3/uL (ref 0.0–0.1)
Immature Granulocytes: 0 %
Lymphocytes Absolute: 2.2 10*3/uL (ref 0.7–3.1)
Lymphs: 31 %
MCH: 31.2 pg (ref 26.6–33.0)
MCHC: 33.2 g/dL (ref 31.5–35.7)
MCV: 94 fL (ref 79–97)
Monocytes Absolute: 0.8 10*3/uL (ref 0.1–0.9)
Monocytes: 12 %
Neutrophils Absolute: 3.2 10*3/uL (ref 1.4–7.0)
Neutrophils: 45 %
Platelets: 238 10*3/uL (ref 150–450)
RBC: 4.04 x10E6/uL (ref 3.77–5.28)
RDW: 12.4 % (ref 11.7–15.4)
WBC: 7.1 10*3/uL (ref 3.4–10.8)

## 2021-11-08 LAB — LIPID PANEL
Chol/HDL Ratio: 2 ratio (ref 0.0–4.4)
Cholesterol, Total: 176 mg/dL (ref 100–199)
HDL: 86 mg/dL (ref >39)
LDL Chol Calc (NIH): 69 mg/dL (ref 0–99)
Triglycerides: 126 mg/dL (ref 0–149)
VLDL Cholesterol Cal: 21 mg/dL (ref 5–40)

## 2021-11-08 LAB — CMP14+EGFR
ALT: 11 IU/L (ref 0–32)
AST: 22 IU/L (ref 0–40)
Albumin/Globulin Ratio: 2.1 (ref 1.2–2.2)
Albumin: 4.9 g/dL (ref 3.9–4.9)
Alkaline Phosphatase: 71 IU/L (ref 44–121)
BUN/Creatinine Ratio: 15 (ref 12–28)
BUN: 20 mg/dL (ref 8–27)
Bilirubin Total: 0.3 mg/dL (ref 0.0–1.2)
CO2: 21 mmol/L (ref 20–29)
Calcium: 10.2 mg/dL (ref 8.7–10.3)
Chloride: 100 mmol/L (ref 96–106)
Creatinine, Ser: 1.33 mg/dL — ABNORMAL HIGH (ref 0.57–1.00)
Globulin, Total: 2.3 g/dL (ref 1.5–4.5)
Glucose: 116 mg/dL — ABNORMAL HIGH (ref 70–99)
Potassium: 3.9 mmol/L (ref 3.5–5.2)
Sodium: 141 mmol/L (ref 134–144)
Total Protein: 7.2 g/dL (ref 6.0–8.5)
eGFR: 44 mL/min/{1.73_m2} — ABNORMAL LOW (ref >59)

## 2021-12-24 ENCOUNTER — Other Ambulatory Visit: Payer: Self-pay | Admitting: Family Medicine

## 2021-12-24 DIAGNOSIS — J439 Emphysema, unspecified: Secondary | ICD-10-CM

## 2022-02-02 ENCOUNTER — Other Ambulatory Visit: Payer: Self-pay | Admitting: Family Medicine

## 2022-02-02 DIAGNOSIS — I10 Essential (primary) hypertension: Secondary | ICD-10-CM

## 2022-04-10 DIAGNOSIS — L309 Dermatitis, unspecified: Secondary | ICD-10-CM | POA: Diagnosis not present

## 2022-04-10 DIAGNOSIS — M79641 Pain in right hand: Secondary | ICD-10-CM | POA: Diagnosis not present

## 2022-04-10 DIAGNOSIS — M1991 Primary osteoarthritis, unspecified site: Secondary | ICD-10-CM | POA: Diagnosis not present

## 2022-04-10 DIAGNOSIS — Z681 Body mass index (BMI) 19 or less, adult: Secondary | ICD-10-CM | POA: Diagnosis not present

## 2022-04-10 DIAGNOSIS — M79642 Pain in left hand: Secondary | ICD-10-CM | POA: Diagnosis not present

## 2022-04-10 DIAGNOSIS — M0579 Rheumatoid arthritis with rheumatoid factor of multiple sites without organ or systems involvement: Secondary | ICD-10-CM | POA: Diagnosis not present

## 2022-04-10 DIAGNOSIS — Z79899 Other long term (current) drug therapy: Secondary | ICD-10-CM | POA: Diagnosis not present

## 2022-05-12 ENCOUNTER — Other Ambulatory Visit: Payer: Self-pay | Admitting: Family Medicine

## 2022-05-13 ENCOUNTER — Ambulatory Visit (INDEPENDENT_AMBULATORY_CARE_PROVIDER_SITE_OTHER): Payer: PPO | Admitting: Family Medicine

## 2022-05-13 ENCOUNTER — Encounter: Payer: Self-pay | Admitting: Family Medicine

## 2022-05-13 VITALS — BP 104/66 | HR 87 | Temp 97.9°F | Ht 61.0 in | Wt 89.4 lb

## 2022-05-13 DIAGNOSIS — Z23 Encounter for immunization: Secondary | ICD-10-CM

## 2022-05-13 DIAGNOSIS — E785 Hyperlipidemia, unspecified: Secondary | ICD-10-CM | POA: Diagnosis not present

## 2022-05-13 DIAGNOSIS — F17200 Nicotine dependence, unspecified, uncomplicated: Secondary | ICD-10-CM

## 2022-05-13 DIAGNOSIS — J439 Emphysema, unspecified: Secondary | ICD-10-CM

## 2022-05-13 DIAGNOSIS — I10 Essential (primary) hypertension: Secondary | ICD-10-CM

## 2022-05-13 MED ORDER — OLMESARTAN MEDOXOMIL-HCTZ 20-12.5 MG PO TABS: ORAL_TABLET | ORAL | 3 refills | Status: DC

## 2022-05-13 MED ORDER — FLUTICASONE FUROATE-VILANTEROL 200-25 MCG/ACT IN AEPB: 1.0000 | INHALATION_SPRAY | Freq: Every day | RESPIRATORY_TRACT | 11 refills | Status: DC

## 2022-05-13 MED ORDER — ROSUVASTATIN CALCIUM 10 MG PO TABS: ORAL_TABLET | ORAL | 3 refills | Status: DC

## 2022-05-13 MED ORDER — ALBUTEROL SULFATE HFA 108 (90 BASE) MCG/ACT IN AERS: 2.0000 | INHALATION_SPRAY | RESPIRATORY_TRACT | 11 refills | Status: DC | PRN

## 2022-05-13 NOTE — Addendum Note (Signed)
Addended by: Baldomero Lamy B on: 05/13/2022 11:43 AM   Modules accepted: Orders

## 2022-05-13 NOTE — Progress Notes (Signed)
Subjective:  Patient ID: Mallory [name], female    DOB: [birth year]  Age: 67 y.o. MRN: [id number]  CC: Medical Management of Chronic Issues   HPI Mallory [name] presents for  follow-up of hypertension. Patient has no history of headache chest pain or shortness of breath or recent cough. Patient also denies symptoms of TIA such as focal numbness or weakness. Patient denies side effects from medication. States taking it regularly.  Started smoking again  2 a day now for 2-3 weeks. Caregiver for elderly Mom caused her to start back after beUsed to smoke 25 pack years +  Breathing okay, but using rescue inhaler occasionally. Needs refills.     in for follow-up of elevated cholesterol. Doing well without complaints on current medication. Denies side effects of statin including myalgia and arthralgia and nausea. Currently no chest pain, shortness of breath or other cardiovascular related symptoms noted.   History Mallory [name] has a past medical history of Asthma, Depression, Hyperlipidemia, Hypertension, and Rheumatoid arthritis ([place]).   She has a past surgical history that includes Abdominal hysterectomy; Fracture surgery (Right); and Mouth surgery.   Her family history includes Cancer in her brother and sister; Stomach cancer in her maternal aunt.She reports that she has been smoking cigarettes. She has a 10.00 pack-year smoking history. She has never used smokeless tobacco. She reports current alcohol use. She reports that she does not use drugs.  Current Outpatient Medications on File Prior to Visit  Medication Sig Dispense Refill   amLODipine (NORVASC) 5 MG tablet TAKE 1 TABLET BY MOUTH EVERY DAY FOR BLOOD PRESSURE 90 tablet 1   hydroxychloroquine (PLAQUENIL) 200 MG tablet Take 200 mg by mouth daily.     leflunomide (ARAVA) 10 MG tablet Take 10 mg by mouth daily.     triamcinolone cream (KENALOG) 0.1 % APPLY TO AFFECTED AREA TWICE A DAY 60 g 5   Current Facility-Administered  Medications on File Prior to Visit  Medication Dose Route Frequency Provider Last Rate Last Admin   0.9 %  sodium chloride infusion  500 mL Intravenous Once Thornton Park, MD        ROS Review of Systems  Constitutional: Negative.   HENT: Negative.    Eyes:  Negative for visual disturbance.  Respiratory:  Positive for shortness of breath (occasional).   Cardiovascular:  Negative for chest pain.  Gastrointestinal:  Negative for abdominal pain.  Musculoskeletal:  Negative for arthralgias.    Objective:  BP 104/66   Pulse 87   Temp 97.9 F (36.6 C)   Ht '5\' 1"'$  (1.549 m)   Wt 89 lb 6.4 oz (40.6 kg)   SpO2 97%   BMI 16.89 kg/m   BP Readings from Last 3 Encounters:  05/13/22 104/66  11/07/21 124/77  09/10/21 97/69    Wt Readings from Last 3 Encounters:  05/13/22 89 lb 6.4 oz (40.6 kg)  11/07/21 89 lb 3.2 oz (40.5 kg)  09/10/21 90 lb (40.8 kg)     Physical Exam Constitutional:      General: She is not in acute distress.    Appearance: She is well-developed.  HENT:     Head: Normocephalic and atraumatic.  Eyes:     Conjunctiva/sclera: Conjunctivae normal.     Pupils: Pupils are equal, round, and reactive to light.  Neck:     Thyroid: No thyromegaly.  Cardiovascular:     Rate and Rhythm: Normal rate and regular rhythm.     Heart sounds: Normal heart sounds. No  murmur heard. Pulmonary:     Effort: Pulmonary effort is normal. No respiratory distress.     Breath sounds: Normal breath sounds. No wheezing or rales.  Abdominal:     General: Bowel sounds are normal. There is no distension.     Palpations: Abdomen is soft.     Tenderness: There is no abdominal tenderness.  Musculoskeletal:        General: Normal range of motion.     Cervical back: Normal range of motion and neck supple.  Lymphadenopathy:     Cervical: No cervical adenopathy.  Skin:    General: Skin is warm and dry.  Neurological:     Mental Status: She is alert and oriented to person, place, and  time.  Psychiatric:        Behavior: Behavior normal.        Thought Content: Thought content normal.        Judgment: Judgment normal.       Assessment & Plan:   Mallory [name] was seen today for medical management of chronic issues.  Diagnoses and all orders for this visit:  Essential hypertension -     CBC with Differential/Platelet -     CMP14+EGFR -     olmesartan-hydrochlorothiazide (BENICAR HCT) 20-12.5 MG tablet; TAKE 1 TABLET EVERY DAY IN THE MORNING FOR BLOOD PRESSURE  Hyperlipidemia, unspecified hyperlipidemia type -     Lipid panel  Pulmonary emphysema, unspecified emphysema type (HCC) -     albuterol (PROAIR HFA) 108 (90 Base) MCG/ACT inhaler; Inhale 2 puffs into the lungs as needed.  Encounter for screening for malignant neoplasm of lung in current smoker with 30 pack year history or greater -     CT CHEST LUNG CANCER SCREENING LOW DOSE WO CONTRAST; Future  Other orders -     fluticasone furoate-vilanterol (BREO ELLIPTA) 200-25 MCG/ACT AEPB; Inhale 1 puff into the lungs daily. -     rosuvastatin (CRESTOR) 10 MG tablet; TAKE 1 TABLET BY MOUTH DAILY. FOR CHOLESTEROL   Allergies as of 05/13/2022       Reactions   Methotrexate    Other reaction(s): mouth ulcers   Aspirin [aspirin] Palpitations        Medication List        Accurate as of May 13, 2022  9:07 AM. If you have any questions, ask your nurse or doctor.          STOP taking these medications    doxepin 25 MG capsule Commonly known as: SINEQUAN Stopped by: Claretta Fraise, MD   Simponi Aria 50 MG/4ML Soln injection Generic drug: golimumab Stopped by: Claretta Fraise, MD       TAKE these medications    albuterol 108 (90 Base) MCG/ACT inhaler Commonly known as: ProAir HFA Inhale 2 puffs into the lungs as needed.   amLODipine 5 MG tablet Commonly known as: NORVASC TAKE 1 TABLET BY MOUTH EVERY DAY FOR BLOOD PRESSURE   fluticasone furoate-vilanterol 200-25 MCG/ACT Aepb Commonly  known as: Breo Ellipta Inhale 1 puff into the lungs daily.   hydroxychloroquine 200 MG tablet Commonly known as: PLAQUENIL Take 200 mg by mouth daily.   leflunomide 10 MG tablet Commonly known as: ARAVA Take 10 mg by mouth daily.   olmesartan-hydrochlorothiazide 20-12.5 MG tablet Commonly known as: BENICAR HCT TAKE 1 TABLET EVERY DAY IN THE MORNING FOR BLOOD PRESSURE   rosuvastatin 10 MG tablet Commonly known as: CRESTOR TAKE 1 TABLET BY MOUTH DAILY. FOR CHOLESTEROL   triamcinolone cream 0.1 %  Commonly known as: KENALOG APPLY TO AFFECTED AREA TWICE A DAY        Meds ordered this encounter  Medications   fluticasone furoate-vilanterol (BREO ELLIPTA) 200-25 MCG/ACT AEPB    Sig: Inhale 1 puff into the lungs daily.    Dispense:  1 each    Refill:  11   olmesartan-hydrochlorothiazide (BENICAR HCT) 20-12.5 MG tablet    Sig: TAKE 1 TABLET EVERY DAY IN THE MORNING FOR BLOOD PRESSURE    Dispense:  90 tablet    Refill:  3   rosuvastatin (CRESTOR) 10 MG tablet    Sig: TAKE 1 TABLET BY MOUTH DAILY. FOR CHOLESTEROL    Dispense:  90 tablet    Refill:  3   albuterol (PROAIR HFA) 108 (90 Base) MCG/ACT inhaler    Sig: Inhale 2 puffs into the lungs as needed.    Dispense:  18 g    Refill:  11    Encouraged smoking cessation before she becomes habituated again  Follow-up: Return in about 6 months (around 11/11/2022).  Claretta Fraise, M.D.

## 2022-05-14 LAB — CMP14+EGFR
ALT: 19 IU/L (ref 0–32)
AST: 24 IU/L (ref 0–40)
Albumin/Globulin Ratio: 2.1 (ref 1.2–2.2)
Albumin: 4.9 g/dL (ref 3.9–4.9)
Alkaline Phosphatase: 76 IU/L (ref 44–121)
BUN/Creatinine Ratio: 23 (ref 12–28)
BUN: 31 mg/dL — ABNORMAL HIGH (ref 8–27)
Bilirubin Total: 0.3 mg/dL (ref 0.0–1.2)
CO2: 21 mmol/L (ref 20–29)
Calcium: 10.1 mg/dL (ref 8.7–10.3)
Chloride: 100 mmol/L (ref 96–106)
Creatinine, Ser: 1.33 mg/dL — ABNORMAL HIGH (ref 0.57–1.00)
Globulin, Total: 2.3 g/dL (ref 1.5–4.5)
Glucose: 90 mg/dL (ref 70–99)
Potassium: 3.7 mmol/L (ref 3.5–5.2)
Sodium: 139 mmol/L (ref 134–144)
Total Protein: 7.2 g/dL (ref 6.0–8.5)
eGFR: 44 mL/min/{1.73_m2} — ABNORMAL LOW (ref >59)

## 2022-05-14 LAB — LIPID PANEL
Chol/HDL Ratio: 1.8 ratio (ref 0.0–4.4)
Cholesterol, Total: 198 mg/dL (ref 100–199)
HDL: 112 mg/dL (ref >39)
LDL Chol Calc (NIH): 73 mg/dL (ref 0–99)
Triglycerides: 71 mg/dL (ref 0–149)
VLDL Cholesterol Cal: 13 mg/dL (ref 5–40)

## 2022-05-14 LAB — CBC WITH DIFFERENTIAL/PLATELET
Basophils Absolute: 0.1 10*3/uL (ref 0.0–0.2)
Basos: 1 %
EOS (ABSOLUTE): 0.1 10*3/uL (ref 0.0–0.4)
Eos: 3 %
Hematocrit: 37.2 % (ref 34.0–46.6)
Hemoglobin: 12.3 g/dL (ref 11.1–15.9)
Immature Grans (Abs): 0 10*3/uL (ref 0.0–0.1)
Immature Granulocytes: 0 %
Lymphocytes Absolute: 1.6 10*3/uL (ref 0.7–3.1)
Lymphs: 35 %
MCH: 31.3 pg (ref 26.6–33.0)
MCHC: 33.1 g/dL (ref 31.5–35.7)
MCV: 95 fL (ref 79–97)
Monocytes Absolute: 0.6 10*3/uL (ref 0.1–0.9)
Monocytes: 13 %
Neutrophils Absolute: 2.2 10*3/uL (ref 1.4–7.0)
Neutrophils: 48 %
Platelets: 249 10*3/uL (ref 150–450)
RBC: 3.93 x10E6/uL (ref 3.77–5.28)
RDW: 13.2 % (ref 11.7–15.4)
WBC: 4.6 10*3/uL (ref 3.4–10.8)

## 2022-05-14 NOTE — Progress Notes (Signed)
Hello Mallory [name],  Your lab result is normal and/or stable.Some minor variations that are not significant are commonly marked abnormal, but do not represent any medical problem for you.  Best regards, Claretta Fraise, M.D.

## 2022-05-30 ENCOUNTER — Telehealth: Payer: Self-pay | Admitting: Family Medicine

## 2022-06-24 ENCOUNTER — Other Ambulatory Visit: Payer: Self-pay | Admitting: Family Medicine

## 2022-06-24 DIAGNOSIS — Z1231 Encounter for screening mammogram for malignant neoplasm of breast: Secondary | ICD-10-CM

## 2022-07-10 DIAGNOSIS — M1991 Primary osteoarthritis, unspecified site: Secondary | ICD-10-CM | POA: Diagnosis not present

## 2022-07-10 DIAGNOSIS — Z79899 Other long term (current) drug therapy: Secondary | ICD-10-CM | POA: Diagnosis not present

## 2022-07-10 DIAGNOSIS — M0579 Rheumatoid arthritis with rheumatoid factor of multiple sites without organ or systems involvement: Secondary | ICD-10-CM | POA: Diagnosis not present

## 2022-07-10 DIAGNOSIS — Z681 Body mass index (BMI) 19 or less, adult: Secondary | ICD-10-CM | POA: Diagnosis not present

## 2022-08-01 ENCOUNTER — Ambulatory Visit: Payer: No Typology Code available for payment source

## 2022-08-06 ENCOUNTER — Ambulatory Visit
Admission: RE | Admit: 2022-08-06 | Discharge: 2022-08-06 | Disposition: A | Discharge status: Home / Self Care | Payer: PPO | Source: Ambulatory Visit | Attending: Family Medicine | Admitting: Family Medicine

## 2022-08-06 DIAGNOSIS — Z1231 Encounter for screening mammogram for malignant neoplasm of breast: Secondary | ICD-10-CM

## 2022-08-13 ENCOUNTER — Ambulatory Visit (INDEPENDENT_AMBULATORY_CARE_PROVIDER_SITE_OTHER): Payer: PPO | Admitting: Family Medicine

## 2022-08-13 ENCOUNTER — Encounter: Payer: Self-pay | Admitting: Family Medicine

## 2022-08-13 VITALS — BP 91/59 | HR 112 | Temp 97.1°F | Ht 61.0 in | Wt 87.2 lb

## 2022-08-13 DIAGNOSIS — Z0001 Encounter for general adult medical examination with abnormal findings: Secondary | ICD-10-CM | POA: Diagnosis not present

## 2022-08-13 DIAGNOSIS — Z23 Encounter for immunization: Secondary | ICD-10-CM | POA: Diagnosis not present

## 2022-08-13 DIAGNOSIS — I10 Essential (primary) hypertension: Secondary | ICD-10-CM

## 2022-08-13 DIAGNOSIS — M0579 Rheumatoid arthritis with rheumatoid factor of multiple sites without organ or systems involvement: Secondary | ICD-10-CM | POA: Diagnosis not present

## 2022-08-13 DIAGNOSIS — Z0000 Encounter for general adult medical examination without abnormal findings: Secondary | ICD-10-CM

## 2022-08-13 MED ORDER — FEXOFENADINE HCL 180 MG PO TABS: 180.0000 mg | ORAL_TABLET | Freq: Every day | ORAL | 11 refills | Status: DC

## 2022-08-13 NOTE — Progress Notes (Signed)
Annual Wellness Visit     Patient: Mallory [name], Female    DOB: 09-08-1955, 67 y.o.   MRN: [id number]  Subjective  Chief Complaint  Patient presents with   WELCOME TO MEDICARE    Mallory [name] is a 67 y.o. female who presents today for her wELCOME TO  Wellness Visit. She reports consuming a general diet. The patient does not participate in regular exercise at present. She generally feels fairly well. She reports sleeping well. She does not have additional problems to discuss today.   HPI   Patient Active Problem List   Diagnosis Date Noted   Rheumatoid arthritis involving multiple sites with positive rheumatoid factor (HCC) 05/12/2019   Chronic rhinitis 07/23/2011   Pulmonary emphysema (HCC) 04/02/2011   Smoker 04/02/2011   Essential hypertension    Hyperlipidemia    Past Medical History:  Diagnosis Date   Asthma    Depression    Hyperlipidemia    Hypertension    Rheumatoid arthritis (HCC)    Past Surgical History:  Procedure Laterality Date   ABDOMINAL HYSTERECTOMY     FRACTURE SURGERY Right    Right Arm   MOUTH SURGERY     Social History   Socioeconomic History   Marital status: Married    Spouse name: Casimiro Needle   Number of children: Not on file   Years of education: Not on file   Highest education level: Some college, no degree  Occupational History   Occupation: Solicitor  Tobacco Use   Smoking status: Every Day    Packs/day: 0.25    Years: 40.00    Additional pack years: 0.00    Total pack years: 10.00    Types: Cigarettes   Smokeless tobacco: Never  Vaping Use   Vaping Use: Never used  Substance and Sexual Activity   Alcohol use: Yes    Comment: occasional   Drug use: No   Sexual activity: Yes    Birth control/protection: Post-menopausal, Surgical  Other Topics Concern   Not on file  Social History Narrative   Retired Solicitor   Lives with husband Casimiro Needle   No children   Social Determinants of Health   Financial  Resource Strain: Medium Risk (08/13/2022)   Overall Financial Resource Strain (CARDIA)    Difficulty of Paying Living Expenses: Somewhat hard  Food Insecurity: No Food Insecurity (08/13/2022)   Hunger Vital Sign    Worried About Running Out of Food in the Last Year: Never true    Ran Out of Food in the Last Year: Never true  Transportation Needs: No Transportation Needs (08/13/2022)   PRAPARE - Administrator, Civil Service (Medical): No    Lack of Transportation (Non-Medical): No  Physical Activity: Sufficiently Active (08/13/2022)   Exercise Vital Sign    Days of Exercise per Week: 3 days    Minutes of Exercise per Session: 60 min  Stress: Stress Concern Present (08/13/2022)   [employer] of Occupational Health - Occupational Stress Questionnaire    Feeling of Stress : To some extent  Social Connections: Moderately Integrated (08/13/2022)   Social Connection and Isolation Panel [NHANES]    Frequency of Communication with Friends and Family: More than three times a week    Frequency of Social Gatherings with Friends and Family: More than three times a week    Attends Religious Services: 1 to 4 times per year    Active Member of Clubs or Organizations: No    Attends  Club or Organization Meetings: Never    Marital Status: Married  Catering manager Violence: Not At Risk (08/13/2022)   Humiliation, Afraid, Rape, and Kick questionnaire    Fear of Current or Ex-Partner: No    Emotionally Abused: No    Physically Abused: No    Sexually Abused: No   Family History  Problem Relation Age of Onset   Dementia Mother    Breast cancer Sister    Cancer Sister        lymphoma   Cancer Brother        colon   Breast cancer Maternal Aunt    Stomach cancer Maternal Aunt    Esophageal cancer Neg Hx    Pancreatic cancer Neg Hx    Liver cancer Neg Hx    Liver disease Neg Hx    Allergies  Allergen Reactions   Methotrexate     Other reaction(s): mouth ulcers   Aspirin [Aspirin]  Palpitations      Medications: Outpatient Medications Prior to Visit  Medication Sig   albuterol (PROAIR HFA) 108 (90 Base) MCG/ACT inhaler Inhale 2 puffs into the lungs as needed.   amLODipine (NORVASC) 5 MG tablet TAKE 1 TABLET BY MOUTH EVERY DAY FOR BLOOD PRESSURE   fluticasone furoate-vilanterol (BREO ELLIPTA) 200-25 MCG/ACT AEPB Inhale 1 puff into the lungs daily.   hydroxychloroquine (PLAQUENIL) 200 MG tablet Take 200 mg by mouth daily.   leflunomide (ARAVA) 10 MG tablet Take 10 mg by mouth daily.   olmesartan-hydrochlorothiazide (BENICAR HCT) 20-12.5 MG tablet TAKE 1 TABLET EVERY DAY IN THE MORNING FOR BLOOD PRESSURE   rosuvastatin (CRESTOR) 10 MG tablet TAKE 1 TABLET BY MOUTH DAILY. FOR CHOLESTEROL   triamcinolone cream (KENALOG) 0.1 % APPLY TO AFFECTED AREA TWICE A DAY   Facility-Administered Medications Prior to Visit  Medication Dose Route Frequency Provider   0.9 %  sodium chloride infusion  500 mL Intravenous Once Tressia Danas, MD    Allergies  Allergen Reactions   Methotrexate     Other reaction(s): mouth ulcers   Aspirin [Aspirin] Palpitations    Patient Care Team: Mechele Claude, MD as PCP - General (Family Medicine)  Review of Systems  Constitutional:  Negative for chills, diaphoresis, fever, malaise/fatigue and weight loss.  HENT:  Negative for congestion, ear pain, hearing loss, nosebleeds, sore throat and tinnitus.   Eyes:  Positive for double vision. Negative for blurred vision, photophobia, pain, discharge and redness.  Respiratory:  Negative for cough, hemoptysis, sputum production, shortness of breath, wheezing and stridor.   Cardiovascular:  Negative for chest pain, palpitations, orthopnea, leg swelling and PND.  Gastrointestinal:  Negative for abdominal pain, blood in stool, constipation, diarrhea, heartburn, melena, nausea and vomiting.  Genitourinary:  Negative for dysuria, flank pain, frequency, hematuria and urgency.  Musculoskeletal:  Positive  for joint pain (knuckles - rheumatoid dx). Negative for back pain, falls, myalgias and neck pain.  Skin:  Negative for itching and rash.  Neurological:  Negative for dizziness, tingling, tremors, sensory change, speech change, focal weakness, seizures, loss of consciousness, weakness and headaches.  Endo/Heme/Allergies:  Negative for environmental allergies and polydipsia. Does not bruise/bleed easily.  Psychiatric/Behavioral:  Negative for depression, hallucinations, memory loss, substance abuse and suicidal ideas. 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         Objective  BP (!) 91/59   Pulse (!) 112   Temp (!) 97.1 F (36.2 C)   Ht 5\' 1"  (1.549  m)   Wt 87 lb 3.2 oz (39.6 kg)   SpO2 95%   BMI 16.48 kg/m  BP Readings from Last 3 Encounters:  08/13/22 (!) 91/59  05/13/22 104/66  11/07/21 124/77      Physical Exam    Most recent functional status assessment:    08/13/2022    9:59 AM  In your present state of health, do you have any difficulty performing the following activities:  Hearing? 0  Vision? 0  Difficulty concentrating or making decisions? 0  Walking or climbing stairs? 0  Dressing or bathing? 0  Doing errands, shopping? 0  Preparing Food and eating ? N  Using the Toilet? N  In the past six months, have you accidently leaked urine? N  Do you have problems with loss of bowel control? N  Managing your Medications? N  Managing your Finances? N  Housekeeping or managing your Housekeeping? N   Most recent fall risk assessment:    08/13/2022    9:59 AM  Fall Risk   Falls in the past year? 0    Most recent depression screenings:    08/13/2022   11:25 AM 08/13/2022    9:59 AM  PHQ 2/9 Scores  PHQ - 2 Score 2 0  PHQ- 9 Score 6    Most recent cognitive screening:    08/13/2022   10:00 AM  6CIT Screen  What Year? 0 points  What month? 0 points  What time? 0 points  Count back from 20 0 points   Months in reverse 0 points  Repeat phrase 2 points  Total Score 2 points   Most recent Audit-C alcohol use screening    08/13/2022    9:56 AM  Alcohol Use Disorder Test (AUDIT)  1. How often do you have a drink containing alcohol? 0  2. How many drinks containing alcohol do you have on a typical day when you are drinking? 0  3. How often do you have six or more drinks on one occasion? 0  AUDIT-C Score 0   A score of 3 or more in women, and 4 or more in men indicates increased risk for alcohol abuse, EXCEPT if all of the points are from question 1   Vision/Hearing Screen: No results found.  Last CBC Lab Results  Component Value Date   WBC 4.6 05/13/2022   HGB 12.3 05/13/2022   HCT 37.2 05/13/2022   MCV 95 05/13/2022   MCH 31.3 05/13/2022   RDW 13.2 05/13/2022   PLT 249 05/13/2022   Last metabolic panel Lab Results  Component Value Date   GLUCOSE 90 05/13/2022   NA 139 05/13/2022   K 3.7 05/13/2022   CL 100 05/13/2022   CO2 21 05/13/2022   BUN 31 (H) 05/13/2022   CREATININE 1.33 (H) 05/13/2022   EGFR 44 (L) 05/13/2022   CALCIUM 10.1 05/13/2022   PROT 7.2 05/13/2022   ALBUMIN 4.9 05/13/2022   LABGLOB 2.3 05/13/2022   AGRATIO 2.1 05/13/2022   BILITOT 0.3 05/13/2022   ALKPHOS 76 05/13/2022   AST 24 05/13/2022   ALT 19 05/13/2022   ANIONGAP 11 12/04/2018   Last lipids Lab Results  Component Value Date   CHOL 198 05/13/2022   HDL 112 05/13/2022   LDLCALC 73 05/13/2022   TRIG 71 05/13/2022   CHOLHDL 1.8 05/13/2022          Assessment & Plan   Annual wellness visit done today including the all of the following: Reviewed patient's  Family Medical History Reviewed and updated list of patient's medical providers Assessment of cognitive impairment was done Assessed patient's functional ability Established a written schedule for health screening services Health Risk Assessent Completed and Reviewed  Exercise Activities and Dietary recommendations  Goals    None     Immunization History  Administered Date(s) Administered   Influenza,inj,Quad PF,6+ Mos 04/01/2014, 12/17/2017, 02/01/2019   Influenza-Unspecified 02/15/2017   Moderna Covid-19 Vaccine Bivalent Booster 55yrs & up 11/23/2021   PFIZER(Purple Top)SARS-COV-2 Vaccination 06/07/2019, 06/25/2019, 01/01/2020   PNEUMOCOCCAL CONJUGATE-20 08/13/2022   Tdap 05/13/2022    Health Maintenance  Topic Date Due   DEXA SCAN  Never done   PAP SMEAR-Modifier  08/13/2022 (Originally 04/01/2002)   COVID-19 Vaccine (5 - 2023-24 season) 08/29/2022 (Originally 01/18/2022)   Zoster Vaccines- Shingrix (1 of 2) 11/13/2022 (Originally 09/04/1975)   HIV Screening  05/14/2023 (Originally 09/04/1971)   INFLUENZA VACCINE  10/17/2022   Medicare Annual Wellness (AWV)  08/13/2023   MAMMOGRAM  08/05/2024   Colonoscopy  08/12/2030   DTaP/Tdap/Td (2 - Td or Tdap) 05/13/2032   Pneumonia Vaccine 58+ Years old  Completed   Hepatitis C Screening  Completed   HPV VACCINES  Aged Out     Discussed health benefits of physical activity, and encouraged her to engage in regular exercise appropriate for her age and condition.    Problem List Items Addressed This Visit       Active Problems   Essential hypertension   Relevant Orders   BMP8+EGFR   Rheumatoid arthritis involving multiple sites with positive rheumatoid factor (HCC)   Relevant Orders   BMP8+EGFR   Other Visit Diagnoses     Welcome to Medicare preventive visit    -  Primary   Relevant Orders   EKG 12-Lead (Completed)   Need for pneumococcal 20-valent conjugate vaccination       Relevant Orders   Pneumococcal conjugate vaccine 20-valent (Prevnar 20) (Completed)       Return in about 6 months (around 02/13/2023).     Mechele Claude, MD

## 2022-08-14 LAB — BMP8+EGFR
BUN/Creatinine Ratio: 16 (ref 12–28)
BUN: 21 mg/dL (ref 8–27)
CO2: 20 mmol/L (ref 20–29)
Calcium: 9.9 mg/dL (ref 8.7–10.3)
Chloride: 100 mmol/L (ref 96–106)
Creatinine, Ser: 1.28 mg/dL — ABNORMAL HIGH (ref 0.57–1.00)
Glucose: 101 mg/dL — ABNORMAL HIGH (ref 70–99)
Potassium: 4.3 mmol/L (ref 3.5–5.2)
Sodium: 138 mmol/L (ref 134–144)
eGFR: 46 mL/min/{1.73_m2} — ABNORMAL LOW (ref >59)

## 2022-08-14 NOTE — Progress Notes (Signed)
Hello Jinx,  Your lab result is normal and/or stable.Some minor variations that are not significant are commonly marked abnormal, but do not represent any medical problem for you.  Best regards, Lucina Betty, M.D.

## 2022-09-04 DIAGNOSIS — M79642 Pain in left hand: Secondary | ICD-10-CM | POA: Diagnosis not present

## 2022-09-04 DIAGNOSIS — Z681 Body mass index (BMI) 19 or less, adult: Secondary | ICD-10-CM | POA: Diagnosis not present

## 2022-09-04 DIAGNOSIS — M79641 Pain in right hand: Secondary | ICD-10-CM | POA: Diagnosis not present

## 2022-09-04 DIAGNOSIS — M1991 Primary osteoarthritis, unspecified site: Secondary | ICD-10-CM | POA: Diagnosis not present

## 2022-09-04 DIAGNOSIS — Z79899 Other long term (current) drug therapy: Secondary | ICD-10-CM | POA: Diagnosis not present

## 2022-09-04 DIAGNOSIS — M0579 Rheumatoid arthritis with rheumatoid factor of multiple sites without organ or systems involvement: Secondary | ICD-10-CM | POA: Diagnosis not present

## 2022-11-10 ENCOUNTER — Other Ambulatory Visit: Payer: Self-pay | Admitting: Family Medicine

## 2022-11-10 DIAGNOSIS — I10 Essential (primary) hypertension: Secondary | ICD-10-CM

## 2022-11-11 ENCOUNTER — Ambulatory Visit: Payer: PPO | Admitting: Family Medicine

## 2022-12-06 DIAGNOSIS — M0579 Rheumatoid arthritis with rheumatoid factor of multiple sites without organ or systems involvement: Secondary | ICD-10-CM | POA: Diagnosis not present

## 2022-12-06 DIAGNOSIS — M79642 Pain in left hand: Secondary | ICD-10-CM | POA: Diagnosis not present

## 2022-12-06 DIAGNOSIS — Z79899 Other long term (current) drug therapy: Secondary | ICD-10-CM | POA: Diagnosis not present

## 2022-12-06 DIAGNOSIS — M79641 Pain in right hand: Secondary | ICD-10-CM | POA: Diagnosis not present

## 2022-12-06 DIAGNOSIS — Z681 Body mass index (BMI) 19 or less, adult: Secondary | ICD-10-CM | POA: Diagnosis not present

## 2023-02-17 ENCOUNTER — Ambulatory Visit (INDEPENDENT_AMBULATORY_CARE_PROVIDER_SITE_OTHER): Payer: PPO | Admitting: Family Medicine

## 2023-02-17 ENCOUNTER — Encounter: Payer: Self-pay | Admitting: Family Medicine

## 2023-02-17 VITALS — BP 119/75 | HR 102 | Temp 97.0°F | Ht 61.0 in | Wt 85.0 lb

## 2023-02-17 DIAGNOSIS — M0579 Rheumatoid arthritis with rheumatoid factor of multiple sites without organ or systems involvement: Secondary | ICD-10-CM

## 2023-02-17 DIAGNOSIS — E785 Hyperlipidemia, unspecified: Secondary | ICD-10-CM

## 2023-02-17 DIAGNOSIS — J439 Emphysema, unspecified: Secondary | ICD-10-CM

## 2023-02-17 DIAGNOSIS — I10 Essential (primary) hypertension: Secondary | ICD-10-CM

## 2023-02-17 MED ORDER — ALBUTEROL SULFATE HFA 108 (90 BASE) MCG/ACT IN AERS: 2.0000 | INHALATION_SPRAY | RESPIRATORY_TRACT | 11 refills | Status: DC | PRN

## 2023-02-17 NOTE — Progress Notes (Signed)
Subjective:  Patient ID: Mallory [name],  female    DOB: 21-Jun-1955  Age: 67 y.o.    CC: Medical Management of Chronic Issues   HPI Mallory [name] presents for  follow-up of hypertension. Patient has no history of headache chest pain or shortness of breath or recent cough. Patient also denies symptoms of TIA such as numbness weakness lateralizing. Patient denies side effects from medication. States taking it regularly.  Patient also  in for follow-up of elevated cholesterol. Doing well without complaints on current medication. Denies side effects  including myalgia and arthralgia and nausea. Also in today for liver function testing. Currently no chest pain, shortness of breath or other cardiovascular related symptoms noted.  Followed for COPD, Breathing not good, off inhaler, breo, for 5 days due to finances. Gets SS check tomorrow. Still smoking. 4-5 a day.   RA , on [employer] working "so-so." Flares with strenuous activity.    History Mallory [name] has a past medical history of Asthma, Depression, Hyperlipidemia, Hypertension, and Rheumatoid arthritis (HCC).   She has a past surgical history that includes Abdominal hysterectomy; Fracture surgery (Right); and Mouth surgery.   Her family history includes Breast cancer in her maternal aunt and sister; Cancer in her brother and sister; Dementia in her mother; Stomach cancer in her maternal aunt.She reports that she has been smoking cigarettes. She has a 10 pack-year smoking history. She has never used smokeless tobacco. She reports current alcohol use. She reports that she does not use drugs.  Current Outpatient Medications on File Prior to Visit  Medication Sig Dispense Refill   amLODipine (NORVASC) 5 MG tablet TAKE 1 TABLET BY MOUTH EVERY DAY FOR BLOOD PRESSURE 90 tablet 1   fexofenadine (ALLEGRA) 180 MG tablet Take 1 tablet (180 mg total) by mouth daily. For allergy symptoms 30 tablet 11   fluticasone furoate-vilanterol (BREO ELLIPTA)  200-25 MCG/ACT AEPB Inhale 1 puff into the lungs daily. 1 each 11   hydroxychloroquine (PLAQUENIL) 200 MG tablet Take 200 mg by mouth daily.     leflunomide (ARAVA) 10 MG tablet Take 10 mg by mouth daily.     olmesartan-hydrochlorothiazide (BENICAR HCT) 20-12.5 MG tablet TAKE 1 TABLET EVERY DAY IN THE MORNING FOR BLOOD PRESSURE 90 tablet 3   rosuvastatin (CRESTOR) 10 MG tablet TAKE 1 TABLET BY MOUTH DAILY. FOR CHOLESTEROL 90 tablet 3   triamcinolone cream (KENALOG) 0.1 % APPLY TO AFFECTED AREA TWICE A DAY 60 g 5   No current facility-administered medications on file prior to visit.    ROS Review of Systems  Objective:  BP 119/75   Pulse (!) 102   Temp (!) 97 F (36.1 C)   Ht 5\' 1"  (1.549 m)   Wt 85 lb (38.6 kg)   SpO2 94%   BMI 16.06 kg/m   BP Readings from Last 3 Encounters:  02/17/23 119/75  08/13/22 (!) 91/59  05/13/22 104/66    Wt Readings from Last 3 Encounters:  02/17/23 85 lb (38.6 kg)  08/13/22 87 lb 3.2 oz (39.6 kg)  05/13/22 89 lb 6.4 oz (40.6 kg)     Physical Exam Constitutional:      General: She is not in acute distress.    Appearance: She is well-developed.  Cardiovascular:     Rate and Rhythm: Normal rate and regular rhythm.  Pulmonary:     Effort: Pulmonary effort is normal.     Breath sounds: Normal breath sounds. No wheezing or rales.     Comments:  Sounds a bit distant with decreased expiratory phase  Musculoskeletal:        General: Normal range of motion.  Skin:    General: Skin is warm and dry.  Neurological:     Mental Status: She is alert and oriented to person, place, and time.     Diabetic Foot Exam - Simple   No data filed     No results found for: "HGBA1C"  Assessment & Plan:   Mallory [name] was seen today for medical management of chronic issues.  Diagnoses and all orders for this visit:  Essential hypertension -     CBC with Differential/Platelet -     CMP14+EGFR  Hyperlipidemia, unspecified hyperlipidemia type -      Lipid panel  Pulmonary emphysema, unspecified emphysema type (HCC) -     albuterol (PROAIR HFA) 108 (90 Base) MCG/ACT inhaler; Inhale 2 puffs into the lungs as needed.  Rheumatoid arthritis involving multiple sites with positive rheumatoid factor (HCC)   I am having Mallory [name] maintain her leflunomide, hydroxychloroquine, triamcinolone cream, fluticasone furoate-vilanterol, olmesartan-hydrochlorothiazide, rosuvastatin, fexofenadine, amLODipine, and albuterol. We will stop administering sodium chloride.  Meds ordered this encounter  Medications   albuterol (PROAIR HFA) 108 (90 Base) MCG/ACT inhaler    Sig: Inhale 2 puffs into the lungs as needed.    Dispense:  18 g    Refill:  11     Follow-up: Return in about 6 months (around 08/18/2023).  Mechele Claude, M.D.

## 2023-02-18 ENCOUNTER — Other Ambulatory Visit: Payer: Self-pay | Admitting: Family Medicine

## 2023-02-18 LAB — CMP14+EGFR
ALT: 13 IU/L (ref 0–32)
AST: 24 IU/L (ref 0–40)
Albumin: 4.7 g/dL (ref 3.9–4.9)
Alkaline Phosphatase: 80 IU/L (ref 44–121)
BUN/Creatinine Ratio: 16 (ref 12–28)
BUN: 25 mg/dL (ref 8–27)
Bilirubin Total: 0.3 mg/dL (ref 0.0–1.2)
CO2: 19 mmol/L — ABNORMAL LOW (ref 20–29)
Calcium: 10.2 mg/dL (ref 8.7–10.3)
Chloride: 102 mmol/L (ref 96–106)
Creatinine, Ser: 1.56 mg/dL — ABNORMAL HIGH (ref 0.57–1.00)
Globulin, Total: 2.8 g/dL (ref 1.5–4.5)
Glucose: 103 mg/dL — ABNORMAL HIGH (ref 70–99)
Potassium: 3.9 mmol/L (ref 3.5–5.2)
Sodium: 139 mmol/L (ref 134–144)
Total Protein: 7.5 g/dL (ref 6.0–8.5)
eGFR: 36 mL/min/{1.73_m2} — ABNORMAL LOW (ref >59)

## 2023-02-18 LAB — LIPID PANEL
Cholesterol, Total: 165 mg/dL (ref 100–199)
HDL: 89 mg/dL (ref >39)
LDL CALC COMMENT:: 1.9 ratio (ref 0.0–4.4)
LDL Chol Calc (NIH): 51 mg/dL (ref 0–99)
Triglycerides: 150 mg/dL — ABNORMAL HIGH (ref 0–149)
VLDL Cholesterol Cal: 25 mg/dL (ref 5–40)

## 2023-02-18 LAB — CBC WITH DIFFERENTIAL/PLATELET
Basophils Absolute: 0.1 10*3/uL (ref 0.0–0.2)
Basos: 1 %
EOS (ABSOLUTE): 0.3 10*3/uL (ref 0.0–0.4)
Eos: 4 %
Hematocrit: 40 % (ref 34.0–46.6)
Hemoglobin: 12.6 g/dL (ref 11.1–15.9)
Immature Grans (Abs): 0 10*3/uL (ref 0.0–0.1)
Immature Granulocytes: 0 %
Lymphocytes Absolute: 1.7 10*3/uL (ref 0.7–3.1)
Lymphs: 26 %
MCH: 31 pg (ref 26.6–33.0)
MCHC: 31.5 g/dL (ref 31.5–35.7)
MCV: 99 fL — ABNORMAL HIGH (ref 79–97)
Monocytes Absolute: 0.6 10*3/uL (ref 0.1–0.9)
Monocytes: 10 %
Neutrophils Absolute: 3.7 10*3/uL (ref 1.4–7.0)
Neutrophils: 59 %
Platelets: 284 10*3/uL (ref 150–450)
RBC: 4.06 x10E6/uL (ref 3.77–5.28)
RDW: 12.5 % (ref 11.7–15.4)
WBC: 6.3 10*3/uL (ref 3.4–10.8)

## 2023-02-18 MED ORDER — OLMESARTAN MEDOXOMIL 40 MG PO TABS
40.0000 mg | ORAL_TABLET | Freq: Every day | ORAL | 1 refills | Status: DC
Start: 2023-02-18 — End: 2023-08-18

## 2023-03-05 DIAGNOSIS — M0579 Rheumatoid arthritis with rheumatoid factor of multiple sites without organ or systems involvement: Secondary | ICD-10-CM | POA: Diagnosis not present

## 2023-03-05 DIAGNOSIS — M1991 Primary osteoarthritis, unspecified site: Secondary | ICD-10-CM | POA: Diagnosis not present

## 2023-03-05 DIAGNOSIS — Z79899 Other long term (current) drug therapy: Secondary | ICD-10-CM | POA: Diagnosis not present

## 2023-03-05 DIAGNOSIS — Z681 Body mass index (BMI) 19 or less, adult: Secondary | ICD-10-CM | POA: Diagnosis not present

## 2023-03-05 DIAGNOSIS — M79641 Pain in right hand: Secondary | ICD-10-CM | POA: Diagnosis not present

## 2023-03-05 DIAGNOSIS — M79642 Pain in left hand: Secondary | ICD-10-CM | POA: Diagnosis not present

## 2023-03-06 LAB — LAB REPORT - SCANNED: EGFR: 65

## 2023-05-14 ENCOUNTER — Other Ambulatory Visit: Payer: Self-pay | Admitting: Family Medicine

## 2023-05-14 DIAGNOSIS — I10 Essential (primary) hypertension: Secondary | ICD-10-CM

## 2023-05-19 ENCOUNTER — Other Ambulatory Visit: Payer: Self-pay | Admitting: Family Medicine

## 2023-06-11 ENCOUNTER — Other Ambulatory Visit (HOSPITAL_COMMUNITY): Payer: Self-pay

## 2023-06-11 DIAGNOSIS — R634 Abnormal weight loss: Secondary | ICD-10-CM | POA: Diagnosis not present

## 2023-06-11 DIAGNOSIS — Z79899 Other long term (current) drug therapy: Secondary | ICD-10-CM | POA: Diagnosis not present

## 2023-06-11 DIAGNOSIS — M79641 Pain in right hand: Secondary | ICD-10-CM | POA: Diagnosis not present

## 2023-06-11 DIAGNOSIS — Z681 Body mass index (BMI) 19 or less, adult: Secondary | ICD-10-CM | POA: Diagnosis not present

## 2023-06-11 DIAGNOSIS — M0579 Rheumatoid arthritis with rheumatoid factor of multiple sites without organ or systems involvement: Secondary | ICD-10-CM | POA: Diagnosis not present

## 2023-06-11 DIAGNOSIS — M79642 Pain in left hand: Secondary | ICD-10-CM | POA: Diagnosis not present

## 2023-07-10 ENCOUNTER — Other Ambulatory Visit: Payer: Self-pay | Admitting: Family Medicine

## 2023-07-10 DIAGNOSIS — Z1231 Encounter for screening mammogram for malignant neoplasm of breast: Secondary | ICD-10-CM

## 2023-08-07 ENCOUNTER — Ambulatory Visit
Admission: RE | Admit: 2023-08-07 | Discharge: 2023-08-07 | Disposition: A | Discharge status: Home / Self Care | Source: Ambulatory Visit | Attending: Family Medicine | Admitting: Family Medicine

## 2023-08-07 DIAGNOSIS — Z1231 Encounter for screening mammogram for malignant neoplasm of breast: Secondary | ICD-10-CM | POA: Diagnosis not present

## 2023-08-14 ENCOUNTER — Other Ambulatory Visit: Payer: Self-pay | Admitting: Family Medicine

## 2023-08-14 DIAGNOSIS — R928 Other abnormal and inconclusive findings on diagnostic imaging of breast: Secondary | ICD-10-CM

## 2023-08-16 ENCOUNTER — Other Ambulatory Visit: Payer: Self-pay | Admitting: Family Medicine

## 2023-08-17 ENCOUNTER — Other Ambulatory Visit: Payer: Self-pay | Admitting: Family Medicine

## 2023-08-18 ENCOUNTER — Ambulatory Visit: Payer: PPO | Admitting: Family Medicine

## 2023-08-18 NOTE — Progress Notes (Signed)
 This encounter was created in error - please disregard.

## 2023-08-20 ENCOUNTER — Ambulatory Visit: Payer: PPO | Admitting: Family Medicine

## 2023-08-28 ENCOUNTER — Ambulatory Visit
Admission: RE | Admit: 2023-08-28 | Discharge: 2023-08-28 | Disposition: A | Discharge status: Home / Self Care | Source: Ambulatory Visit | Attending: Family Medicine | Admitting: Family Medicine

## 2023-08-28 DIAGNOSIS — R928 Other abnormal and inconclusive findings on diagnostic imaging of breast: Secondary | ICD-10-CM

## 2023-09-04 ENCOUNTER — Encounter: Payer: Self-pay | Admitting: Family Medicine

## 2023-09-04 ENCOUNTER — Ambulatory Visit (INDEPENDENT_AMBULATORY_CARE_PROVIDER_SITE_OTHER): Admitting: Family Medicine

## 2023-09-04 VITALS — BP 162/96 | HR 103 | Temp 98.4°F | Ht 61.0 in | Wt 76.0 lb

## 2023-09-04 DIAGNOSIS — I10 Essential (primary) hypertension: Secondary | ICD-10-CM | POA: Diagnosis not present

## 2023-09-04 DIAGNOSIS — M0579 Rheumatoid arthritis with rheumatoid factor of multiple sites without organ or systems involvement: Secondary | ICD-10-CM | POA: Diagnosis not present

## 2023-09-04 DIAGNOSIS — E785 Hyperlipidemia, unspecified: Secondary | ICD-10-CM | POA: Diagnosis not present

## 2023-09-04 DIAGNOSIS — R5383 Other fatigue: Secondary | ICD-10-CM

## 2023-09-04 DIAGNOSIS — R197 Diarrhea, unspecified: Secondary | ICD-10-CM

## 2023-09-04 DIAGNOSIS — J439 Emphysema, unspecified: Secondary | ICD-10-CM | POA: Diagnosis not present

## 2023-09-04 LAB — LIPID PANEL

## 2023-09-04 MED ORDER — QUETIAPINE FUMARATE 25 MG PO TABS: 25.0000 mg | ORAL_TABLET | Freq: Every day | ORAL | 1 refills | Status: DC

## 2023-09-04 NOTE — Progress Notes (Unsigned)
 Subjective:  Patient ID: Mallory [name], female    DOB: 04/03/1955  Age: 68 y.o. MRN: [id number]  CC: Medical Management of Chronic Issues (6 month)   HPI Mallory [name] presents for tiredness. Taking care of elderly mom. Not getting enough sleep. Driving to Mom's home 5 times a week.   Restarted meds last week. Had been off of them due to illness for about 3 weeks. Had diarrhea. Some mucous in her Bms still. Started when Mom wandered away 5 weeks ago.        09/04/2023    1:27 PM 02/17/2023    8:49 AM 08/13/2022   11:25 AM  Depression screen PHQ 2/9  Decreased Interest 0 0 1  Down, Depressed, Hopeless 0 0 1  PHQ - 2 Score 0 0 2  Altered sleeping 0  2  Tired, decreased energy 0  2  Change in appetite 0  0  Feeling bad or failure about yourself  0  0  Trouble concentrating 0  0  Moving slowly or fidgety/restless 0  0  Suicidal thoughts 0  0  PHQ-9 Score 0  6  Difficult doing work/chores Not difficult at all  Not difficult at all    History Mallory [name] has a past medical history of Asthma, Depression, Hyperlipidemia, Hypertension, and Rheumatoid arthritis (HCC).   She has a past surgical history that includes Abdominal hysterectomy; Fracture surgery (Right); and Mouth surgery.   Her family history includes Breast cancer in her maternal aunt and sister; Cancer in her brother and sister; Dementia in her mother; Stomach cancer in her maternal aunt.She reports that she has been smoking cigarettes. She has a 10 pack-year smoking history. She has never used smokeless tobacco. She reports current alcohol use. She reports that she does not use drugs.    ROS Review of Systems  Objective:  BP (!) 162/96   Pulse (!) 103   Temp 98.4 F (36.9 C)   Ht 5' 1 (1.549 m)   Wt 76 lb (34.5 kg)   SpO2 90%   BMI 14.36 kg/m   BP Readings from Last 3 Encounters:  09/04/23 (!) 162/96  02/17/23 119/75  08/13/22 (!) 91/59    Wt Readings from Last 3 Encounters:  09/04/23 76 lb (34.5  kg)  02/17/23 85 lb (38.6 kg)  08/13/22 87 lb 3.2 oz (39.6 kg)     Physical Exam   Assessment & Plan:  Essential hypertension -     Lipid panel -     CBC with Differential/Platelet -     CMP14+EGFR  Hyperlipidemia, unspecified hyperlipidemia type -     Lipid panel -     CBC with Differential/Platelet -     CMP14+EGFR  Pulmonary emphysema, unspecified emphysema type (HCC) -     Lipid panel -     CBC with Differential/Platelet -     CMP14+EGFR  Rheumatoid arthritis involving multiple sites with positive rheumatoid factor (HCC) -     Lipid panel -     CBC with Differential/Platelet -     CMP14+EGFR  Diarrhea of presumed infectious origin -     Lipid panel -     CBC with Differential/Platelet -     CMP14+EGFR -     Clostridium difficile EIA  Fatigue, unspecified type -     TSH  Other orders -     QUEtiapine  Fumarate; Take 1 tablet (25 mg total) by mouth at bedtime.  Dispense: 90 tablet; Refill: 1  Follow-up: Return in about 6 months (around 03/05/2024).  Butler Der, M.D.

## 2023-09-05 DIAGNOSIS — M79641 Pain in right hand: Secondary | ICD-10-CM | POA: Diagnosis not present

## 2023-09-05 DIAGNOSIS — Z681 Body mass index (BMI) 19 or less, adult: Secondary | ICD-10-CM | POA: Diagnosis not present

## 2023-09-05 DIAGNOSIS — R634 Abnormal weight loss: Secondary | ICD-10-CM | POA: Diagnosis not present

## 2023-09-05 DIAGNOSIS — M0579 Rheumatoid arthritis with rheumatoid factor of multiple sites without organ or systems involvement: Secondary | ICD-10-CM | POA: Diagnosis not present

## 2023-09-05 DIAGNOSIS — M79642 Pain in left hand: Secondary | ICD-10-CM | POA: Diagnosis not present

## 2023-09-05 DIAGNOSIS — Z79899 Other long term (current) drug therapy: Secondary | ICD-10-CM | POA: Diagnosis not present

## 2023-09-05 LAB — CMP14+EGFR
ALT: 13 IU/L (ref 0–32)
AST: 23 IU/L (ref 0–40)
Albumin: 4.5 g/dL (ref 3.9–4.9)
Alkaline Phosphatase: 82 IU/L (ref 44–121)
BUN/Creatinine Ratio: 18 (ref 12–28)
BUN: 14 mg/dL (ref 8–27)
Bilirubin Total: 0.6 mg/dL (ref 0.0–1.2)
CO2: 21 mmol/L (ref 20–29)
Calcium: 10.1 mg/dL (ref 8.7–10.3)
Chloride: 102 mmol/L (ref 96–106)
Creatinine, Ser: 0.79 mg/dL (ref 0.57–1.00)
Globulin, Total: 2.9 g/dL (ref 1.5–4.5)
Glucose: 89 mg/dL (ref 70–99)
Potassium: 4 mmol/L (ref 3.5–5.2)
Sodium: 141 mmol/L (ref 134–144)
Total Protein: 7.4 g/dL (ref 6.0–8.5)
eGFR: 82 mL/min/{1.73_m2} (ref >59)

## 2023-09-05 LAB — CBC WITH DIFFERENTIAL/PLATELET
Basophils Absolute: 0.1 10*3/uL (ref 0.0–0.2)
Basos: 1 %
EOS (ABSOLUTE): 0.3 10*3/uL (ref 0.0–0.4)
Eos: 4 %
Hematocrit: 45 % (ref 34.0–46.6)
Hemoglobin: 14.7 g/dL (ref 11.1–15.9)
Immature Grans (Abs): 0 10*3/uL (ref 0.0–0.1)
Immature Granulocytes: 0 %
Lymphocytes Absolute: 1.9 10*3/uL (ref 0.7–3.1)
Lymphs: 33 %
MCH: 32.1 pg (ref 26.6–33.0)
MCHC: 32.7 g/dL (ref 31.5–35.7)
MCV: 98 fL — ABNORMAL HIGH (ref 79–97)
Monocytes Absolute: 0.8 10*3/uL (ref 0.1–0.9)
Monocytes: 13 %
Neutrophils Absolute: 2.9 10*3/uL (ref 1.4–7.0)
Neutrophils: 49 %
Platelets: 318 10*3/uL (ref 150–450)
RBC: 4.58 x10E6/uL (ref 3.77–5.28)
RDW: 12.5 % (ref 11.7–15.4)
WBC: 5.9 10*3/uL (ref 3.4–10.8)

## 2023-09-05 LAB — LIPID PANEL
Chol/HDL Ratio: 2.3 ratio (ref 0.0–4.4)
Cholesterol, Total: 203 mg/dL — ABNORMAL HIGH (ref 100–199)
HDL: 88 mg/dL (ref >39)
LDL Chol Calc (NIH): 97 mg/dL (ref 0–99)
Triglycerides: 101 mg/dL (ref 0–149)
VLDL Cholesterol Cal: 18 mg/dL (ref 5–40)

## 2023-09-05 LAB — TSH: TSH: 1.8 u[IU]/mL (ref 0.450–4.500)

## 2023-09-09 ENCOUNTER — Ambulatory Visit: Payer: Self-pay | Admitting: Family Medicine

## 2023-09-09 NOTE — Progress Notes (Signed)
Hello Mallory [name],  Your lab result is normal and/or stable.Some minor variations that are not significant are commonly marked abnormal, but do not represent any medical problem for you.  Best regards, Yahir Tavano, M.D.

## 2023-09-10 ENCOUNTER — Telehealth: Payer: Self-pay | Admitting: Family Medicine

## 2023-09-10 ENCOUNTER — Encounter: Payer: Self-pay | Admitting: Family Medicine

## 2023-09-10 NOTE — Telephone Encounter (Signed)
 E2C2 read lab result notes.

## 2023-09-10 NOTE — Telephone Encounter (Signed)
 Copied from CRM [id number]. Topic: Clinical - Lab/Test Results >> Sep 10, 2023  8:13 AM Ivette P wrote: Reason for CRM: PT called in to check in lab results  Read follows:  Mallory Lowers, MD to Mallory [name]     09/09/23 10:37 PM Hello Mallory [name],    Your lab result is normal and/or stable.Some minor variations that are not significant are commonly marked abnormal, but do not represent any medical problem for you.   Best regards, [name] Mallory, M.D.   Pt understood, no further questions

## 2023-09-10 NOTE — Telephone Encounter (Signed)
 Duplicate

## 2023-09-10 NOTE — Telephone Encounter (Signed)
 Copied from CRM [id number]. Topic: Clinical - Lab/Test Results >> Sep 10, 2023  8:13 AM Ivette P wrote: Reason for CRM: PT called in to check in lab results  Read follows:  [name], MD to [name]     09/09/23 10:37 PM Hello Montie,    Your lab result is normal and/or stable.Some minor variations that are not significant are commonly marked abnormal, but do not represent any medical problem for you.   Best regards, Lowers Zollie, M.D.   Pt understood, no further questions

## 2023-09-10 NOTE — Telephone Encounter (Signed)
 Copied from CRM [id number]. Topic: Clinical - Lab/Test Results >> Sep 10, 2023  8:13 AM Ivette P wrote: Reason for CRM: PT called in to check in lab results  Read follows:  [name], MD to [name]     09/09/23 10:37 PM Hello Mallory [name],    Your lab result is normal and/or stable.Some minor variations that are not significant are commonly marked abnormal, but do not represent any medical problem for you.   Best regards, Lowers Zollie, M.D.   Pt understood, no further questions

## 2023-09-16 ENCOUNTER — Other Ambulatory Visit: Payer: Self-pay | Admitting: Family Medicine

## 2023-11-15 ENCOUNTER — Other Ambulatory Visit: Payer: Self-pay | Admitting: Family Medicine

## 2023-11-15 DIAGNOSIS — I10 Essential (primary) hypertension: Secondary | ICD-10-CM

## 2023-11-26 ENCOUNTER — Ambulatory Visit (INDEPENDENT_AMBULATORY_CARE_PROVIDER_SITE_OTHER)

## 2023-11-26 ENCOUNTER — Other Ambulatory Visit

## 2023-11-26 VITALS — BP 162/96 | HR 103 | Ht 61.0 in | Wt 76.0 lb

## 2023-11-26 DIAGNOSIS — Z0000 Encounter for general adult medical examination without abnormal findings: Secondary | ICD-10-CM

## 2023-11-26 DIAGNOSIS — Z13820 Encounter for screening for osteoporosis: Secondary | ICD-10-CM

## 2023-11-26 NOTE — Progress Notes (Signed)
 Subjective:   Mallory [name] is a 68 y.o. who presents for a Medicare Wellness preventive visit.  As a reminder, Annual Wellness Visits don't include a physical exam, and some assessments may be limited, especially if this visit is performed virtually. We may recommend an in-person follow-up visit with your provider if needed.  Visit Complete: Virtual I connected with  Mallory [name] on 11/26/23 by a audio enabled telemedicine application and verified that I am speaking with the correct person using two identifiers.  Patient Location: Home  Provider Location: Home Office  I discussed the limitations of evaluation and management by telemedicine. The patient expressed understanding and agreed to proceed.  Vital Signs: Because this visit was a virtual/telehealth visit, some criteria may be missing or patient reported. Any vitals not documented were not able to be obtained and vitals that have been documented are patient reported.  VideoDeclined- This patient declined Librarian, academic. Therefore the visit was completed with audio only.  Persons Participating in Visit: Patient.  AWV Questionnaire: No: Patient Medicare AWV questionnaire was not completed prior to this visit.  Cardiac Risk Factors include: dyslipidemia;hypertension;smoking/ tobacco exposure     Objective:    Today's Vitals   11/26/23 0850  BP: (!) 162/96  Pulse: (!) 103  Weight: 76 lb (34.5 kg)  Height: 5' 1 (1.549 m)   Body mass index is 14.36 kg/m.     11/26/2023    8:48 AM 08/13/2022    9:58 AM 12/04/2018    4:39 PM  Advanced Directives  Does Patient Have a Medical Advance Directive? No No No  Would patient like information on creating a medical advance directive?  No - Patient declined No - Patient declined    Current Medications (verified) Outpatient Encounter Medications as of 11/26/2023  Medication Sig   albuterol  (PROAIR  HFA) 108 (90 Base) MCG/ACT inhaler Inhale 2  puffs into the lungs as needed.   amLODipine  (NORVASC ) 5 MG tablet TAKE 1 TABLET BY MOUTH EVERY DAY FOR BLOOD PRESSURE   fluticasone  furoate-vilanterol (BREO ELLIPTA ) 200-25 MCG/ACT AEPB TAKE 1 PUFF BY MOUTH EVERY DAY   hydroxychloroquine (PLAQUENIL) 200 MG tablet Take 200 mg by mouth daily.   leflunomide (ARAVA) 10 MG tablet Take 10 mg by mouth daily.   olmesartan  (BENICAR ) 40 MG tablet TAKE 1 TABLET (40 MG TOTAL) BY MOUTH DAILY FOR BLOOD PRESSURE   triamcinolone  cream (KENALOG ) 0.1 % APPLY TO AFFECTED AREA TWICE A DAY   QUEtiapine  (SEROQUEL ) 25 MG tablet Take 1 tablet (25 mg total) by mouth at bedtime. (Patient not taking: Reported on 11/26/2023)   rosuvastatin  (CRESTOR ) 10 MG tablet TAKE 1 TABLET BY MOUTH EVERY DAY FOR CHOLESTEROL (Patient not taking: Reported on 11/26/2023)   No facility-administered encounter medications on file as of 11/26/2023.    Allergies (verified) Methotrexate and Aspirin [aspirin]   History: Past Medical History:  Diagnosis Date   Asthma    Depression    Hyperlipidemia    Hypertension    Rheumatoid arthritis (HCC)    Past Surgical History:  Procedure Laterality Date   ABDOMINAL HYSTERECTOMY     FRACTURE SURGERY Right    Right Arm   MOUTH SURGERY     Family History  Problem Relation Age of Onset   Dementia Mother    Breast cancer Sister    Cancer Sister        lymphoma   Cancer Brother        colon   Breast cancer  Maternal Aunt    Stomach cancer Maternal Aunt    Esophageal cancer Neg Hx    Pancreatic cancer Neg Hx    Liver cancer Neg Hx    Liver disease Neg Hx    Social History   Socioeconomic History   Marital status: Married    Spouse name: Mallory [name]   Number of children: Not on file   Years of education: Not on file   Highest education level: Some college, no degree  Occupational History   Occupation: Solicitor  Tobacco Use   Smoking status: Every Day    Current packs/day: 0.25    Average packs/day: 0.3 packs/day for 40.0 years  (10.0 ttl pk-yrs)    Types: Cigarettes   Smokeless tobacco: Never  Vaping Use   Vaping status: Never Used  Substance and Sexual Activity   Alcohol use: Yes    Comment: occasional   Drug use: No   Sexual activity: Yes    Birth control/protection: Post-menopausal, Surgical  Other Topics Concern   Not on file  Social History Narrative   Retired Solicitor   Lives with husband Mallory [name]   No children   Social Drivers of Corporate investment banker Strain: Low Risk  (11/26/2023)   Overall Financial Resource Strain (CARDIA)    Difficulty of Paying Living Expenses: Not very hard  Food Insecurity: No Food Insecurity (11/26/2023)   Hunger Vital Sign    Worried About Running Out of Food in the Last Year: Never true    Ran Out of Food in the Last Year: Never true  Transportation Needs: No Transportation Needs (11/26/2023)   PRAPARE - Administrator, Civil Service (Medical): No    Lack of Transportation (Non-Medical): No  Physical Activity: Inactive (11/26/2023)   Exercise Vital Sign    Days of Exercise per Week: 0 days    Minutes of Exercise per Session: 0 min  Stress: Stress Concern Present (11/26/2023)   [employer] of Occupational Health - Occupational Stress Questionnaire    Feeling of Stress: To some extent  Social Connections: Moderately Integrated (11/26/2023)   Social Connection and Isolation Panel    Frequency of Communication with Friends and Family: More than three times a week    Frequency of Social Gatherings with Friends and Family: More than three times a week    Attends Religious Services: 1 to 4 times per year    Active Member of [employer] or Organizations: No    Attends Engineer, structural: Never    Marital Status: Married    Tobacco Counseling Ready to quit: No Counseling given: Yes    Clinical Intake:  Pre-visit preparation completed: Yes  Pain : No/denies pain     BMI - recorded: 14.36 Nutritional Status: BMI <19   Underweight Nutritional Risks: None Diabetes: No  No results found for: HGBA1C   How often do you need to have someone help you when you read instructions, pamphlets, or other written materials from your doctor or pharmacy?: 1 - Never  Interpreter Needed?: No  Information entered by :: alia t/cma   Activities of Daily Living     11/26/2023    8:48 AM  In your present state of health, do you have any difficulty performing the following activities:  Hearing? 0  Vision? 0  Difficulty concentrating or making decisions? 0  Walking or climbing stairs? 0  Dressing or bathing? 0  Doing errands, shopping? 0  Preparing Food and eating ? N  Using the Toilet? N  In the past six months, have you accidently leaked urine? N  Do you have problems with loss of bowel control? N  Managing your Medications? N  Managing your Finances? N  Housekeeping or managing your Housekeeping? N    Patient Care Team: [name], MD as PCP - General (Family Medicine)  I have updated your Care Teams any recent Medical Services you may have received from other providers in the past year.     Assessment:   This is a routine wellness examination for Mallory [name].  Hearing/Vision screen Hearing Screening - Comments:: Pt denies hearing dif Vision Screening - Comments:: Pt wear glasses/pt goes to [facility] in [place]/last ov couple yrs, suggest to get an updated eye exam   Goals Addressed             This Visit's Progress    Patient Stated       To remain healthy and see another, going to the beach        Depression Screen     11/26/2023    8:52 AM 09/04/2023    1:27 PM 02/17/2023    8:49 AM 08/13/2022   11:25 AM 08/13/2022    9:59 AM 08/13/2022    9:56 AM 05/13/2022    8:19 AM  PHQ 2/9 Scores  PHQ - 2 Score 0 0 0 2 0 0 0  PHQ- 9 Score  0  6       Fall Risk     11/26/2023    8:42 AM 09/04/2023    1:27 PM 02/17/2023    8:48 AM 08/13/2022    9:59 AM 05/13/2022    8:19 AM  Fall Risk   Falls  in the past year? 0 0 0 0 0  Number falls in past yr: 0 0     Injury with Fall? 0 0     Risk for fall due to : No Fall Risks No Fall Risks     Follow up Falls evaluation completed Falls evaluation completed       MEDICARE RISK AT HOME:  Medicare Risk at Home Any stairs in or around the home?: Yes If so, are there any without handrails?: Yes Home free of loose throw rugs in walkways, pet beds, electrical cords, etc?: Yes Adequate lighting in your home to reduce risk of falls?: Yes Life alert?: No Use of a cane, walker or w/c?: No Grab bars in the bathroom?: Yes Shower chair or bench in shower?: No Elevated toilet seat or a handicapped toilet?: No  TIMED UP AND GO:  Was the test performed?  no  Cognitive Function: 6CIT completed        11/26/2023    8:55 AM 08/13/2022   10:00 AM  6CIT Screen  What Year? 0 points 0 points  What month? 0 points 0 points  What time? 0 points 0 points  Count back from 20 0 points 0 points  Months in reverse 0 points 0 points  Repeat phrase 0 points 2 points  Total Score 0 points 2 points    Immunizations Immunization History  Administered Date(s) Administered   Fluad Quad(high Dose 65+) 11/23/2021   INFLUENZA, HIGH DOSE SEASONAL PF 11/14/2022   Influenza,inj,Quad PF,6+ Mos 04/01/2014, 12/17/2017, 02/01/2019   Influenza-Unspecified 02/15/2017   PFIZER(Purple Top)SARS-COV-2 Vaccination 06/07/2019, 06/25/2019, 01/01/2020   PNEUMOCOCCAL CONJUGATE-20 08/13/2022   Tdap 05/13/2022    Screening Tests Health Maintenance  Topic Date Due   Zoster Vaccines- Shingrix (1 of  2) Never done   DEXA SCAN  Never done   Influenza Vaccine  10/17/2023   COVID-19 Vaccine (4 - 2025-26 season) 11/17/2023   Medicare Annual Wellness (AWV)  11/25/2024   MAMMOGRAM  08/06/2025   Colonoscopy  08/12/2030   DTaP/Tdap/Td (2 - Td or Tdap) 05/13/2032   Pneumococcal Vaccine: 50+ Years  Completed   Hepatitis C Screening  Completed   HPV VACCINES  Aged Out    Meningococcal B Vaccine  Aged Out    Health Maintenance Items Addressed: DEXA ordered  Additional Screening:  Vision Screening: Recommended annual ophthalmology exams for early detection of glaucoma and other disorders of the eye. Is the patient up to date with their annual eye exam?  No  Who is the provider or what is the name of the office in which the patient attends annual eye exams? [facility]  Dental Screening: Recommended annual dental exams for proper oral hygiene  Community Resource Referral / Chronic Care Management: CRR required this visit?  No   CCM required this visit?  No   Plan:    I have personally reviewed and noted the following in the patient's chart:   Medical and social history Use of alcohol, tobacco or illicit drugs  Current medications and supplements including opioid prescriptions. Patient is not currently taking opioid prescriptions. Functional ability and status Nutritional status Physical activity Advanced directives List of other physicians Hospitalizations, surgeries, and ER visits in previous 12 months Vitals Screenings to include cognitive, depression, and falls Referrals and appointments  In addition, I have reviewed and discussed with patient certain preventive protocols, quality metrics, and best practice recommendations. A written personalized care plan for preventive services as well as general preventive health recommendations were provided to patient.   Mallory [name], CMA   11/26/2023   After Visit Summary: (MyChart) Due to this being a telephonic visit, the after visit summary with patients personalized plan was offered to patient via MyChart   Notes: Nothing significant to report at this time.

## 2023-11-26 NOTE — Patient Instructions (Signed)
 [name],  Thank you for taking the time for your Medicare Wellness Visit. I appreciate your continued commitment to your health goals. Please review the care plan we discussed, and feel free to reach out if I can assist you further.  Medicare recommends these wellness visits once per year to help you and your care team stay ahead of potential health issues. These visits are designed to focus on prevention, allowing your provider to concentrate on managing your acute and chronic conditions during your regular appointments.  Please note that Annual Wellness Visits do not include a physical exam. Some assessments may be limited, especially if the visit was conducted virtually. If needed, we may recommend a separate in-person follow-up with your provider.  Ongoing Care Seeing your primary care provider every 3 to 6 months helps us  monitor your health and provide consistent, personalized care.   Referrals If a referral was made during today's visit and you haven't received any updates within two weeks, please contact the referred provider directly to check on the status.  Recommended Screenings:  Health Maintenance  Topic Date Due   Zoster (Shingles) Vaccine (1 of 2) Never done   DEXA scan (bone density measurement)  Never done   Medicare Annual Wellness Visit  08/13/2023   Flu Shot  10/17/2023   COVID-19 Vaccine (4 - 2025-26 season) 11/17/2023   Mammogram  08/06/2025   Colon Cancer Screening  08/12/2030   DTaP/Tdap/Td vaccine (2 - Td or Tdap) 05/13/2032   Pneumococcal Vaccine for age over 31  Completed   Hepatitis C Screening  Completed   HPV Vaccine  Aged Out   Meningitis B Vaccine  Aged Out       11/26/2023    8:48 AM  Advanced Directives  Does Patient Have a Medical Advance Directive? No   Advance Care Planning is important because it: Ensures you receive medical care that aligns with your values, goals, and preferences. Provides guidance to your family and loved ones,  reducing the emotional burden of decision-making during critical moments.  Vision: Annual vision screenings are recommended for early detection of glaucoma, cataracts, and diabetic retinopathy. These exams can also reveal signs of chronic conditions such as diabetes and high blood pressure.  Dental: Annual dental screenings help detect early signs of oral cancer, gum disease, and other conditions linked to overall health, including heart disease and diabetes.  Please see the attached documents for additional preventive care recommendations.

## 2023-12-05 DIAGNOSIS — M0579 Rheumatoid arthritis with rheumatoid factor of multiple sites without organ or systems involvement: Secondary | ICD-10-CM | POA: Diagnosis not present

## 2023-12-05 DIAGNOSIS — Z79899 Other long term (current) drug therapy: Secondary | ICD-10-CM | POA: Diagnosis not present

## 2023-12-05 DIAGNOSIS — Z681 Body mass index (BMI) 19 or less, adult: Secondary | ICD-10-CM | POA: Diagnosis not present

## 2023-12-05 DIAGNOSIS — M79642 Pain in left hand: Secondary | ICD-10-CM | POA: Diagnosis not present

## 2023-12-05 DIAGNOSIS — M79641 Pain in right hand: Secondary | ICD-10-CM | POA: Diagnosis not present

## 2023-12-29 ENCOUNTER — Ambulatory Visit

## 2024-01-14 ENCOUNTER — Other Ambulatory Visit: Payer: Self-pay | Admitting: Family Medicine

## 2024-02-12 ENCOUNTER — Other Ambulatory Visit: Payer: Self-pay | Admitting: Family Medicine

## 2024-03-08 ENCOUNTER — Ambulatory Visit (INDEPENDENT_AMBULATORY_CARE_PROVIDER_SITE_OTHER): Payer: Self-pay

## 2024-03-08 ENCOUNTER — Encounter: Payer: Self-pay | Admitting: Family Medicine

## 2024-03-08 ENCOUNTER — Ambulatory Visit: Payer: Self-pay | Admitting: Family Medicine

## 2024-03-08 VITALS — BP 175/95 | HR 83 | Temp 97.7°F | Ht 61.0 in | Wt 80.0 lb

## 2024-03-08 DIAGNOSIS — E785 Hyperlipidemia, unspecified: Secondary | ICD-10-CM | POA: Diagnosis not present

## 2024-03-08 DIAGNOSIS — Z13820 Encounter for screening for osteoporosis: Secondary | ICD-10-CM

## 2024-03-08 DIAGNOSIS — Z23 Encounter for immunization: Secondary | ICD-10-CM

## 2024-03-08 DIAGNOSIS — Z0000 Encounter for general adult medical examination without abnormal findings: Secondary | ICD-10-CM

## 2024-03-08 DIAGNOSIS — I10 Essential (primary) hypertension: Secondary | ICD-10-CM

## 2024-03-08 LAB — LIPID PANEL

## 2024-03-08 MED ORDER — AMLODIPINE BESYLATE 10 MG PO TABS: ORAL_TABLET | ORAL | 1 refills | Status: AC

## 2024-03-08 MED ORDER — OLMESARTAN MEDOXOMIL 40 MG PO TABS: 40.0000 mg | ORAL_TABLET | Freq: Every day | ORAL | 1 refills | Status: AC

## 2024-03-08 NOTE — Progress Notes (Signed)
 "  Subjective:  Patient ID: Mallory [name], female    DOB: 1955/11/22  Age: 68 y.o. MRN: [id number]  CC: Medical Management of Chronic Issues   HPI  Discussed the use of AI scribe software for clinical note transcription with the patient, who gave verbal consent to proceed.  History of Present Illness Mallory [name] is a 68 year old female with hypertension and arthritis who presents for a follow-up on blood pressure, cholesterol, and sleep.  Her sleep has improved without the need for quetiapine , which she only used twice. This improvement is attributed to receiving help with caregiving responsibilities for her mother, who has dementia.  She has a history of hypertension and is currently taking her blood pressure medication regularly. Her blood pressure was high today, possibly due to consuming two cups of coffee. She recalls a recent reading of 130/90 mmHg, but today's reading was 175/95 mmHg.  She stopped taking her cholesterol medication, rosuvastatin , months ago due to not eating well. She has since improved her nutrition, gaining four pounds, and is now eating better. She has a supply of the medication at home but has not resumed taking it.  She has arthritis and is taking Arava (leflunomide) 10 mg daily and Plaquenil (hydroxychloroquine) once a day. She experiences occasional flare-ups, particularly in her fingers, which worsened after cooking Thanksgiving dinner.  She uses an inhaler daily for breathing issues and reports improvement since her last visit. She experiences occasional nasal congestion and drainage, which she manages with nasal spray. No dizziness, and she is not short of breath today.          03/08/2024   10:26 AM 11/26/2023    8:52 AM 09/04/2023    1:27 PM  Depression screen PHQ 2/9  Decreased Interest 0 0 0  Down, Depressed, Hopeless 0 0 0  PHQ - 2 Score 0 0 0  Altered sleeping 0  0  Tired, decreased energy 1  0  Change in appetite 1  0  Feeling bad  or failure about yourself  0  0  Trouble concentrating 0  0  Moving slowly or fidgety/restless 0  0  Suicidal thoughts 0  0  PHQ-9 Score 2  0   Difficult doing work/chores   Not difficult at all     Data saved with a previous flowsheet row definition    History Mallory [name] has a past medical history of Asthma, Depression, Hyperlipidemia, Hypertension, and Rheumatoid arthritis (HCC).   She has a past surgical history that includes Abdominal hysterectomy; Fracture surgery (Right); and Mouth surgery.   Her family history includes Breast cancer in her maternal aunt and sister; Cancer in her brother and sister; Dementia in her mother; Stomach cancer in her maternal aunt.She reports that she has been smoking cigarettes. She has a 10 pack-year smoking history. She has never used smokeless tobacco. She reports current alcohol use. She reports that she does not use drugs.    ROS Review of Systems  Constitutional: Negative.   HENT:  Negative for congestion.   Eyes:  Negative for visual disturbance.  Respiratory:  Negative for shortness of breath.   Cardiovascular:  Negative for chest pain.  Gastrointestinal:  Negative for abdominal pain, constipation, diarrhea, nausea and vomiting.  Genitourinary:  Negative for difficulty urinating.  Musculoskeletal:  Negative for arthralgias and myalgias.  Neurological:  Negative for headaches.  Psychiatric/Behavioral:  Negative for sleep disturbance.     Objective:  BP (!) 175/95   Pulse 83  Temp 97.7 F (36.5 C)   Ht 5' 1 (1.549 m)   Wt 80 lb (36.3 kg)   SpO2 97%   BMI 15.12 kg/m   BP Readings from Last 3 Encounters:  03/08/24 (!) 175/95  11/26/23 (!) 162/96  09/04/23 (!) 162/96    Wt Readings from Last 3 Encounters:  03/08/24 80 lb (36.3 kg)  11/26/23 76 lb (34.5 kg)  09/04/23 76 lb (34.5 kg)     Physical Exam Constitutional:      General: She is not in acute distress.    Appearance: She is well-developed.  Cardiovascular:      Rate and Rhythm: Normal rate and regular rhythm.  Pulmonary:     Breath sounds: Normal breath sounds.  Musculoskeletal:        General: Normal range of motion.  Skin:    General: Skin is warm and dry.  Neurological:     Mental Status: She is alert and oriented to person, place, and time.    Physical Exam .   Assessment & Plan:  Hyperlipidemia, unspecified hyperlipidemia type -     CMP14+EGFR -     CBC with Differential/Platelet -     Lipid panel  Essential hypertension -     amLODIPine  Besylate; TAKE 1 TABLET BY MOUTH EVERY DAY FOR BLOOD PRESSURE  Dispense: 90 tablet; Refill: 1 -     CMP14+EGFR -     CBC with Differential/Platelet  Encounter for immunization -     Flu vaccine HIGH DOSE PF(Fluzone Trivalent)  Other orders -     Olmesartan  Medoxomil; Take 1 tablet (40 mg total) by mouth daily. For blood pressure  Dispense: 90 tablet; Refill: 1    Assessment and Plan Assessment & Plan Essential hypertension   Blood pressure is elevated at 175/95 mmHg, indicating poor control and risk for cardiovascular events. Non-adherence to medication may contribute. Increased amlodipine  to double strength once daily. Instructed to take both blood pressure medications together daily. Advised weekly home blood pressure checks by her sister. Return for follow-up if readings remain above 130/80 mmHg.  Hyperlipidemia   Not taking rosuvastatin  due to concerns about taking it without food. Checked cholesterol levels today. If high, resume rosuvastatin . If normal, maintain a strict diet.  Rheumatoid arthritis   Experiencing flare-ups in fingers, especially after cooking. Inconsistent with leflunomide and hydroxychloroquine due to dietary issues. Resume both medications daily. Follow up with rheumatologist in three months.  Asthma   Well-controlled with daily inhaler use. No shortness of breath, but some wheezing noted. Nasal congestion may contribute. Continue daily inhaler use.  Low body  weight   Weight increased from 76 to 80 pounds, but remains low for height of 5'1. Goal is to increase weight to over 100 pounds, focusing on muscle gain. Encouraged high-protein diet and regular exercise.  General health maintenance   Due for a bone density scan. Declined shingles vaccination today. Will schedule bone density scan and discuss Pap smear and other screenings at next visit.       Follow-up: Return in about 6 months (around 09/06/2024) for [facility] physical.  Butler Der, M.D. "

## 2024-03-09 ENCOUNTER — Other Ambulatory Visit: Payer: Self-pay | Admitting: Family Medicine

## 2024-03-09 ENCOUNTER — Ambulatory Visit: Payer: Self-pay | Admitting: Family Medicine

## 2024-03-09 LAB — CBC WITH DIFFERENTIAL/PLATELET
Basophils Absolute: 0.1 x10E3/uL (ref 0.0–0.2)
Basos: 1 %
EOS (ABSOLUTE): 0.1 x10E3/uL (ref 0.0–0.4)
Eos: 2 %
Hematocrit: 47.5 % — ABNORMAL HIGH (ref 34.0–46.6)
Hemoglobin: 14.6 g/dL (ref 11.1–15.9)
Immature Grans (Abs): 0 x10E3/uL (ref 0.0–0.1)
Immature Granulocytes: 0 %
Lymphocytes Absolute: 1.5 x10E3/uL (ref 0.7–3.1)
Lymphs: 28 %
MCH: 30.5 pg (ref 26.6–33.0)
MCHC: 30.7 g/dL — ABNORMAL LOW (ref 31.5–35.7)
MCV: 99 fL — ABNORMAL HIGH (ref 79–97)
Monocytes Absolute: 0.5 x10E3/uL (ref 0.1–0.9)
Monocytes: 9 %
Neutrophils Absolute: 3.1 x10E3/uL (ref 1.4–7.0)
Neutrophils: 60 %
Platelets: 359 x10E3/uL (ref 150–450)
RBC: 4.78 x10E6/uL (ref 3.77–5.28)
RDW: 12.3 % (ref 11.7–15.4)
WBC: 5.2 x10E3/uL (ref 3.4–10.8)

## 2024-03-09 LAB — LIPID PANEL
Cholesterol, Total: 243 mg/dL — AB (ref 100–199)
HDL: 107 mg/dL
LDL CALC COMMENT:: 2.3 ratio (ref 0.0–4.4)
LDL Chol Calc (NIH): 119 mg/dL — AB (ref 0–99)
Triglycerides: 102 mg/dL (ref 0–149)
VLDL Cholesterol Cal: 17 mg/dL (ref 5–40)

## 2024-03-09 LAB — CMP14+EGFR
ALT: 11 IU/L (ref 0–32)
AST: 20 IU/L (ref 0–40)
Albumin: 4.7 g/dL (ref 3.9–4.9)
Alkaline Phosphatase: 92 IU/L (ref 49–135)
BUN/Creatinine Ratio: 20 (ref 12–28)
BUN: 13 mg/dL (ref 8–27)
Bilirubin Total: 0.3 mg/dL (ref 0.0–1.2)
CO2: 25 mmol/L (ref 20–29)
Calcium: 10 mg/dL (ref 8.7–10.3)
Chloride: 100 mmol/L (ref 96–106)
Creatinine, Ser: 0.66 mg/dL (ref 0.57–1.00)
Globulin, Total: 2.8 g/dL (ref 1.5–4.5)
Glucose: 91 mg/dL (ref 70–99)
Potassium: 4 mmol/L (ref 3.5–5.2)
Sodium: 138 mmol/L (ref 134–144)
Total Protein: 7.5 g/dL (ref 6.0–8.5)
eGFR: 96 mL/min/1.73

## 2024-03-09 MED ORDER — ALENDRONATE SODIUM 70 MG PO TABS: 70.0000 mg | ORAL_TABLET | ORAL | 3 refills | Status: AC

## 2024-03-09 NOTE — Progress Notes (Signed)
 DEXA shows osteoporosis that is fairly advanced. I recommend weekly fosamax .Also you should take a calcium  suppelment of at least 200 mg of calcium  twice a day and Vitamin D  2000 units every day. Nurse, if pt. Is agreeable, send in Fosamax  70 mg weekly, #13.  Thanks, WS

## 2024-03-10 ENCOUNTER — Telehealth: Payer: Self-pay

## 2024-03-10 NOTE — Telephone Encounter (Signed)
 Copied from CRM #[id number]. Topic: Clinical - Lab/Test Results >> Mar 09, 2024  4:52 PM Delon DASEN wrote: Reason for CRM: patient returned call about lab and DEXA results, read labs -[phone]

## 2024-03-13 ENCOUNTER — Other Ambulatory Visit: Payer: Self-pay | Admitting: Family Medicine

## 2024-04-08 ENCOUNTER — Other Ambulatory Visit: Payer: Self-pay | Admitting: Family Medicine

## 2024-04-08 DIAGNOSIS — J439 Emphysema, unspecified: Secondary | ICD-10-CM

## 2024-04-10 ENCOUNTER — Other Ambulatory Visit: Payer: Self-pay | Admitting: Family Medicine

## 2024-04-21 NOTE — Telephone Encounter (Signed)
 Spoke to patient and confirmed  lab results and Dexa results.  She picked up the Fosamax  and will follow through on supplement recommendations. She says she continues to take 5 mg amlodopine. She says she takes her B/P twice a week and it is normal.  She believes her elevated B/P at last check was due to prednisone  shot given by rheumatology two days prior.

## 2024-09-06 ENCOUNTER — Encounter: Admitting: Family Medicine

## 2024-11-26 ENCOUNTER — Ambulatory Visit: Payer: Self-pay
